# Patient Record
Sex: Male | Born: 1945 | Race: White | Hispanic: No | Smoking: Former smoker
Health system: Southern US, Community
[De-identification: ages and names within clinical notes are randomized; demographics above are authoritative.]

## PROBLEM LIST (undated history)

## (undated) DIAGNOSIS — R739 Hyperglycemia, unspecified: Secondary | ICD-10-CM

## (undated) DIAGNOSIS — R945 Abnormal results of liver function studies: Secondary | ICD-10-CM

## (undated) DIAGNOSIS — K5090 Crohn's disease, unspecified, without complications: Secondary | ICD-10-CM

## (undated) DIAGNOSIS — C801 Malignant (primary) neoplasm, unspecified: Secondary | ICD-10-CM

## (undated) DIAGNOSIS — H269 Unspecified cataract: Secondary | ICD-10-CM

## (undated) DIAGNOSIS — N400 Enlarged prostate without lower urinary tract symptoms: Secondary | ICD-10-CM

## (undated) DIAGNOSIS — R7989 Other specified abnormal findings of blood chemistry: Secondary | ICD-10-CM

## (undated) DIAGNOSIS — I10 Essential (primary) hypertension: Secondary | ICD-10-CM

## (undated) DIAGNOSIS — D589 Hereditary hemolytic anemia, unspecified: Secondary | ICD-10-CM

## (undated) DIAGNOSIS — N200 Calculus of kidney: Secondary | ICD-10-CM

## (undated) DIAGNOSIS — K8309 Other cholangitis: Secondary | ICD-10-CM

## (undated) DIAGNOSIS — L409 Psoriasis, unspecified: Secondary | ICD-10-CM

## (undated) HISTORY — DX: Psoriasis, unspecified: L40.9

## (undated) HISTORY — DX: Hyperglycemia, unspecified: R73.9

## (undated) HISTORY — DX: Other cholangitis: K83.09

## (undated) HISTORY — DX: Hereditary hemolytic anemia, unspecified: D58.9

## (undated) HISTORY — PX: HAMMER TOE SURGERY: SHX385

## (undated) HISTORY — DX: Essential (primary) hypertension: I10

## (undated) HISTORY — DX: Benign prostatic hyperplasia without lower urinary tract symptoms: N40.0

## (undated) HISTORY — PX: LUMBAR LAMINECTOMY: SHX95

## (undated) HISTORY — PX: CYST REMOVAL WITH BONE GRAFT: SHX6365

## (undated) HISTORY — PX: OTHER SURGICAL HISTORY: SHX169

## (undated) HISTORY — DX: Malignant (primary) neoplasm, unspecified: C80.1

## (undated) HISTORY — DX: Calculus of kidney: N20.0

## (undated) HISTORY — DX: Unspecified cataract: H26.9

## (undated) HISTORY — DX: Crohn's disease, unspecified, without complications: K50.90

## (undated) HISTORY — PX: HAND SURGERY: SHX662

## (undated) HISTORY — DX: Other specified abnormal findings of blood chemistry: R79.89

## (undated) HISTORY — DX: Abnormal results of liver function studies: R94.5

## (undated) HISTORY — PX: STAPEDECTOMY: SHX2435

---

## 1997-12-17 ENCOUNTER — Inpatient Hospital Stay (HOSPITAL_COMMUNITY): Admission: EM | Admit: 1997-12-17 | Discharge: 1997-12-22 | Payer: Self-pay | Admitting: Internal Medicine

## 1997-12-17 ENCOUNTER — Encounter (INDEPENDENT_AMBULATORY_CARE_PROVIDER_SITE_OTHER): Payer: Self-pay | Admitting: *Deleted

## 1997-12-17 ENCOUNTER — Encounter: Payer: Self-pay | Admitting: Internal Medicine

## 1997-12-18 ENCOUNTER — Encounter: Payer: Self-pay | Admitting: Hematology and Oncology

## 2000-10-16 ENCOUNTER — Encounter (INDEPENDENT_AMBULATORY_CARE_PROVIDER_SITE_OTHER): Payer: Self-pay | Admitting: *Deleted

## 2000-10-16 ENCOUNTER — Other Ambulatory Visit: Admission: RE | Admit: 2000-10-16 | Discharge: 2000-10-16 | Payer: Self-pay | Admitting: Internal Medicine

## 2000-10-29 ENCOUNTER — Ambulatory Visit (HOSPITAL_COMMUNITY): Admission: RE | Admit: 2000-10-29 | Discharge: 2000-10-29 | Payer: Self-pay | Admitting: Internal Medicine

## 2000-10-29 ENCOUNTER — Encounter: Payer: Self-pay | Admitting: Internal Medicine

## 2003-10-19 ENCOUNTER — Encounter: Payer: Self-pay | Admitting: Internal Medicine

## 2004-01-12 ENCOUNTER — Ambulatory Visit: Payer: Self-pay | Admitting: Internal Medicine

## 2004-05-01 ENCOUNTER — Ambulatory Visit: Payer: Self-pay | Admitting: Internal Medicine

## 2004-05-13 ENCOUNTER — Encounter: Admission: RE | Admit: 2004-05-13 | Discharge: 2004-05-13 | Payer: Self-pay | Admitting: Emergency Medicine

## 2004-05-21 ENCOUNTER — Ambulatory Visit: Payer: Self-pay | Admitting: Internal Medicine

## 2004-10-08 ENCOUNTER — Ambulatory Visit: Payer: Self-pay | Admitting: Oncology

## 2004-10-16 ENCOUNTER — Ambulatory Visit: Payer: Self-pay | Admitting: Internal Medicine

## 2004-11-07 ENCOUNTER — Ambulatory Visit: Payer: Self-pay | Admitting: Internal Medicine

## 2005-02-06 ENCOUNTER — Encounter (INDEPENDENT_AMBULATORY_CARE_PROVIDER_SITE_OTHER): Payer: Self-pay | Admitting: *Deleted

## 2005-02-06 ENCOUNTER — Ambulatory Visit (HOSPITAL_BASED_OUTPATIENT_CLINIC_OR_DEPARTMENT_OTHER): Admission: RE | Admit: 2005-02-06 | Discharge: 2005-02-06 | Payer: Self-pay | Admitting: Orthopedic Surgery

## 2005-03-20 ENCOUNTER — Ambulatory Visit: Payer: Self-pay | Admitting: Internal Medicine

## 2005-08-12 ENCOUNTER — Ambulatory Visit: Payer: Self-pay | Admitting: Internal Medicine

## 2005-08-15 ENCOUNTER — Ambulatory Visit: Payer: Self-pay | Admitting: Internal Medicine

## 2005-09-04 ENCOUNTER — Ambulatory Visit: Payer: Self-pay | Admitting: Internal Medicine

## 2005-10-15 ENCOUNTER — Ambulatory Visit: Payer: Self-pay | Admitting: Internal Medicine

## 2005-12-03 ENCOUNTER — Ambulatory Visit: Payer: Self-pay | Admitting: Internal Medicine

## 2005-12-04 ENCOUNTER — Ambulatory Visit: Payer: Self-pay | Admitting: Internal Medicine

## 2006-05-04 ENCOUNTER — Ambulatory Visit: Payer: Self-pay | Admitting: Internal Medicine

## 2006-05-04 LAB — CONVERTED CEMR LAB
ALT: 81 units/L — ABNORMAL HIGH (ref 0–40)
AST: 49 units/L — ABNORMAL HIGH (ref 0–37)
Albumin: 3.6 g/dL (ref 3.5–5.2)
Alkaline Phosphatase: 202 units/L — ABNORMAL HIGH (ref 39–117)
BUN: 9 mg/dL (ref 6–23)
Basophils Absolute: 0 10*3/uL (ref 0.0–0.1)
Basophils Relative: 0.4 % (ref 0.0–1.0)
Bilirubin, Direct: 0.2 mg/dL (ref 0.0–0.3)
CO2: 30 meq/L (ref 19–32)
Calcium: 9.5 mg/dL (ref 8.4–10.5)
Chloride: 106 meq/L (ref 96–112)
Creatinine, Ser: 0.9 mg/dL (ref 0.4–1.5)
Eosinophils Absolute: 0.2 10*3/uL (ref 0.0–0.6)
Eosinophils Relative: 4 % (ref 0.0–5.0)
GFR calc Af Amer: 111 mL/min
GFR calc non Af Amer: 92 mL/min
Glucose, Bld: 94 mg/dL (ref 70–99)
HCT: 47.3 % (ref 39.0–52.0)
Hemoglobin: 16.8 g/dL (ref 13.0–17.0)
Lymphocytes Relative: 12.5 % (ref 12.0–46.0)
MCHC: 35.5 g/dL (ref 30.0–36.0)
MCV: 90.6 fL (ref 78.0–100.0)
Monocytes Absolute: 0.9 10*3/uL — ABNORMAL HIGH (ref 0.2–0.7)
Monocytes Relative: 14.2 % — ABNORMAL HIGH (ref 3.0–11.0)
Neutro Abs: 4.3 10*3/uL (ref 1.4–7.7)
Neutrophils Relative %: 68.9 % (ref 43.0–77.0)
Platelets: 331 10*3/uL (ref 150–400)
Potassium: 4.1 meq/L (ref 3.5–5.1)
RBC: 5.23 M/uL (ref 4.22–5.81)
RDW: 11.9 % (ref 11.5–14.6)
Sodium: 141 meq/L (ref 135–145)
Total Bilirubin: 0.9 mg/dL (ref 0.3–1.2)
Total Protein: 7.6 g/dL (ref 6.0–8.3)
WBC: 6.2 10*3/uL (ref 4.5–10.5)

## 2006-10-31 ENCOUNTER — Ambulatory Visit: Payer: Self-pay | Admitting: Internal Medicine

## 2006-12-01 ENCOUNTER — Ambulatory Visit: Payer: Self-pay | Admitting: Internal Medicine

## 2007-01-18 ENCOUNTER — Encounter: Payer: Self-pay | Admitting: *Deleted

## 2007-01-18 DIAGNOSIS — D599 Acquired hemolytic anemia, unspecified: Secondary | ICD-10-CM | POA: Insufficient documentation

## 2007-01-18 DIAGNOSIS — S022XXA Fracture of nasal bones, initial encounter for closed fracture: Secondary | ICD-10-CM | POA: Insufficient documentation

## 2007-01-18 DIAGNOSIS — Z9889 Other specified postprocedural states: Secondary | ICD-10-CM | POA: Insufficient documentation

## 2007-01-18 DIAGNOSIS — Z87898 Personal history of other specified conditions: Secondary | ICD-10-CM | POA: Insufficient documentation

## 2007-01-18 DIAGNOSIS — Z87442 Personal history of urinary calculi: Secondary | ICD-10-CM | POA: Insufficient documentation

## 2007-01-18 DIAGNOSIS — J189 Pneumonia, unspecified organism: Secondary | ICD-10-CM | POA: Insufficient documentation

## 2007-01-18 DIAGNOSIS — K5090 Crohn's disease, unspecified, without complications: Secondary | ICD-10-CM | POA: Insufficient documentation

## 2007-01-18 DIAGNOSIS — Z9189 Other specified personal risk factors, not elsewhere classified: Secondary | ICD-10-CM | POA: Insufficient documentation

## 2007-01-18 DIAGNOSIS — K5190 Ulcerative colitis, unspecified, without complications: Secondary | ICD-10-CM | POA: Insufficient documentation

## 2007-01-18 DIAGNOSIS — S42009A Fracture of unspecified part of unspecified clavicle, initial encounter for closed fracture: Secondary | ICD-10-CM | POA: Insufficient documentation

## 2007-01-18 DIAGNOSIS — K8309 Other cholangitis: Secondary | ICD-10-CM

## 2007-01-18 DIAGNOSIS — Z872 Personal history of diseases of the skin and subcutaneous tissue: Secondary | ICD-10-CM | POA: Insufficient documentation

## 2007-01-18 DIAGNOSIS — K8020 Calculus of gallbladder without cholecystitis without obstruction: Secondary | ICD-10-CM | POA: Insufficient documentation

## 2007-01-29 DIAGNOSIS — K589 Irritable bowel syndrome without diarrhea: Secondary | ICD-10-CM

## 2007-05-11 ENCOUNTER — Ambulatory Visit: Payer: Self-pay | Admitting: Internal Medicine

## 2007-07-06 DIAGNOSIS — Z8639 Personal history of other endocrine, nutritional and metabolic disease: Secondary | ICD-10-CM

## 2007-07-06 DIAGNOSIS — Z862 Personal history of diseases of the blood and blood-forming organs and certain disorders involving the immune mechanism: Secondary | ICD-10-CM

## 2007-07-07 ENCOUNTER — Ambulatory Visit: Payer: Self-pay | Admitting: Internal Medicine

## 2007-07-07 DIAGNOSIS — L408 Other psoriasis: Secondary | ICD-10-CM | POA: Insufficient documentation

## 2007-07-13 LAB — CONVERTED CEMR LAB
ALT: 121 units/L — ABNORMAL HIGH (ref 0–53)
AST: 77 units/L — ABNORMAL HIGH (ref 0–37)
Albumin: 3.4 g/dL — ABNORMAL LOW (ref 3.5–5.2)
Alkaline Phosphatase: 467 units/L — ABNORMAL HIGH (ref 39–117)
Basophils Absolute: 0 10*3/uL (ref 0.0–0.1)
Basophils Relative: 0.5 % (ref 0.0–1.0)
Bilirubin, Direct: 0.3 mg/dL (ref 0.0–0.3)
Eosinophils Absolute: 0.2 10*3/uL (ref 0.0–0.7)
Eosinophils Relative: 3.1 % (ref 0.0–5.0)
HCT: 44.6 % (ref 39.0–52.0)
Hemoglobin: 15.7 g/dL (ref 13.0–17.0)
Lymphocytes Relative: 11.9 % — ABNORMAL LOW (ref 12.0–46.0)
MCHC: 35.1 g/dL (ref 30.0–36.0)
MCV: 92.7 fL (ref 78.0–100.0)
Monocytes Absolute: 0.8 10*3/uL (ref 0.1–1.0)
Monocytes Relative: 11.9 % (ref 3.0–12.0)
Neutro Abs: 4.7 10*3/uL (ref 1.4–7.7)
Neutrophils Relative %: 72.6 % (ref 43.0–77.0)
Platelets: 300 10*3/uL (ref 150–400)
RBC: 4.81 M/uL (ref 4.22–5.81)
RDW: 12.6 % (ref 11.5–14.6)
Total Bilirubin: 1.3 mg/dL — ABNORMAL HIGH (ref 0.3–1.2)
Total Protein: 8.2 g/dL (ref 6.0–8.3)
WBC: 6.5 10*3/uL (ref 4.5–10.5)

## 2007-07-14 ENCOUNTER — Ambulatory Visit: Payer: Self-pay | Admitting: Internal Medicine

## 2007-08-18 ENCOUNTER — Ambulatory Visit: Payer: Self-pay | Admitting: Internal Medicine

## 2007-08-19 LAB — CONVERTED CEMR LAB
Bilirubin, Direct: 0.2 mg/dL (ref 0.0–0.3)
Total Bilirubin: 0.9 mg/dL (ref 0.3–1.2)

## 2007-09-28 ENCOUNTER — Encounter: Payer: Self-pay | Admitting: Internal Medicine

## 2007-10-28 ENCOUNTER — Ambulatory Visit: Payer: Self-pay | Admitting: Internal Medicine

## 2007-11-22 ENCOUNTER — Ambulatory Visit: Payer: Self-pay | Admitting: Internal Medicine

## 2007-11-22 LAB — CONVERTED CEMR LAB
ALT: 112 units/L — ABNORMAL HIGH (ref 0–53)
AST: 75 units/L — ABNORMAL HIGH (ref 0–37)
Alkaline Phosphatase: 446 units/L — ABNORMAL HIGH (ref 39–117)
Bilirubin, Direct: 0.3 mg/dL (ref 0.0–0.3)
Total Protein: 7.7 g/dL (ref 6.0–8.3)

## 2007-12-06 ENCOUNTER — Encounter: Payer: Self-pay | Admitting: Internal Medicine

## 2008-01-24 ENCOUNTER — Ambulatory Visit: Payer: Self-pay | Admitting: Internal Medicine

## 2008-06-15 ENCOUNTER — Ambulatory Visit: Payer: Self-pay | Admitting: Internal Medicine

## 2008-06-16 LAB — CONVERTED CEMR LAB
Albumin: 3.5 g/dL (ref 3.5–5.2)
Alkaline Phosphatase: 370 units/L — ABNORMAL HIGH (ref 39–117)
Basophils Absolute: 0 10*3/uL (ref 0.0–0.1)
Bilirubin, Direct: 0.3 mg/dL (ref 0.0–0.3)
Eosinophils Absolute: 0.2 10*3/uL (ref 0.0–0.7)
Lymphocytes Relative: 15.8 % (ref 12.0–46.0)
MCHC: 34.8 g/dL (ref 30.0–36.0)
Neutrophils Relative %: 67.3 % (ref 43.0–77.0)
RDW: 12.4 % (ref 11.5–14.6)

## 2008-09-18 ENCOUNTER — Encounter (INDEPENDENT_AMBULATORY_CARE_PROVIDER_SITE_OTHER): Payer: Self-pay | Admitting: *Deleted

## 2008-09-26 ENCOUNTER — Encounter: Payer: Self-pay | Admitting: Internal Medicine

## 2008-10-19 ENCOUNTER — Ambulatory Visit: Payer: Self-pay | Admitting: Internal Medicine

## 2008-10-25 ENCOUNTER — Ambulatory Visit: Payer: Self-pay | Admitting: Internal Medicine

## 2008-11-08 ENCOUNTER — Ambulatory Visit: Payer: Self-pay | Admitting: Internal Medicine

## 2008-11-08 ENCOUNTER — Encounter: Payer: Self-pay | Admitting: Internal Medicine

## 2008-11-09 ENCOUNTER — Encounter: Payer: Self-pay | Admitting: Internal Medicine

## 2008-11-21 ENCOUNTER — Encounter: Payer: Self-pay | Admitting: Internal Medicine

## 2009-04-13 ENCOUNTER — Ambulatory Visit: Payer: Self-pay | Admitting: Internal Medicine

## 2009-04-16 LAB — CONVERTED CEMR LAB
ALT: 74 units/L — ABNORMAL HIGH (ref 0–53)
BUN: 11 mg/dL (ref 6–23)
Basophils Absolute: 0.1 10*3/uL (ref 0.0–0.1)
Basophils Relative: 1.2 % (ref 0.0–3.0)
CO2: 32 meq/L (ref 19–32)
Calcium: 9.5 mg/dL (ref 8.4–10.5)
Creatinine, Ser: 0.8 mg/dL (ref 0.4–1.5)
GFR calc non Af Amer: 104.15 mL/min (ref >60)
Glucose, Bld: 94 mg/dL (ref 70–99)
HCT: 43.6 % (ref 39.0–52.0)
Hemoglobin: 15.4 g/dL (ref 13.0–17.0)
Lymphs Abs: 0.9 10*3/uL (ref 0.7–4.0)
MCHC: 35.4 g/dL (ref 30.0–36.0)
Monocytes Relative: 10.8 % (ref 3.0–12.0)
Neutro Abs: 4.9 10*3/uL (ref 1.4–7.7)
RBC: 4.73 M/uL (ref 4.22–5.81)
RDW: 14 % (ref 11.5–14.6)
Total Bilirubin: 0.8 mg/dL (ref 0.3–1.2)

## 2009-10-02 ENCOUNTER — Encounter: Payer: Self-pay | Admitting: Internal Medicine

## 2009-11-08 ENCOUNTER — Ambulatory Visit: Payer: Self-pay | Admitting: Internal Medicine

## 2009-11-13 ENCOUNTER — Ambulatory Visit: Payer: Self-pay | Admitting: Internal Medicine

## 2010-01-22 ENCOUNTER — Telehealth (INDEPENDENT_AMBULATORY_CARE_PROVIDER_SITE_OTHER): Payer: Self-pay | Admitting: *Deleted

## 2010-01-24 ENCOUNTER — Other Ambulatory Visit: Payer: Self-pay | Admitting: Internal Medicine

## 2010-01-24 ENCOUNTER — Ambulatory Visit
Admission: RE | Admit: 2010-01-24 | Discharge: 2010-01-24 | Payer: Self-pay | Source: Home / Self Care | Attending: Internal Medicine | Admitting: Internal Medicine

## 2010-01-24 LAB — TESTOSTERONE: Testosterone: 387.67 ng/dL (ref 350.00–890.00)

## 2010-01-24 LAB — PSA: PSA: 1.28 ng/mL (ref 0.10–4.00)

## 2010-01-31 ENCOUNTER — Ambulatory Visit
Admission: RE | Admit: 2010-01-31 | Discharge: 2010-01-31 | Payer: Self-pay | Source: Home / Self Care | Attending: Internal Medicine | Admitting: Internal Medicine

## 2010-02-07 NOTE — Assessment & Plan Note (Signed)
Summary: f/u--ch.    History of Present Illness Visit Type: Follow-up Visit Primary GI MD: [name] MD Primary Provider: Hoover Browns Norins,MD Chief Complaint: follow-up Crohn's Disease  Denies any problems at this time History of Present Illness:   This is a 65 year old white male with inflammatory bowel disease since 1998 which is currently in remission. 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 The diagnosis made on a liver biopsy in March 2005. His last appointment was September 2011 with Dr. Tamala Julian. His last MRCP showed mild dilatation of intrahepatic ducts and stable liver function tests with an AST of 50, ALT of 66, alkaline phosphatase of 269 and bilirubin of 0.8. His last colonoscopy on October 2010 showed no active disease but did show extensive pseudopolyps. His terminal ileum was normal. He is on 6-MP 25 mg daily and Asacol 2.4 g daily. He is treated for psoriasis. He has decreased his alcohol consumption. He has also stopped smoking. He has asymptomatic cholelithiasis. There is a history of hemolytic anemia in 1998 which initially responded to steroids. He has been off steroids now for several years. A bone density according to the patient was normal about 3 years ago   GI Review of Systems      Denies abdominal pain, acid reflux, belching, bloating, chest pain, dysphagia with liquids, dysphagia with solids, heartburn, loss of appetite, nausea, vomiting, vomiting blood, weight loss, and  weight gain.        Denies anal fissure, black tarry stools, change in bowel habit, constipation, diarrhea, diverticulosis, fecal incontinence, heme positive stool, hemorrhoids, irritable bowel syndrome, jaundice, light color stool, liver problems, rectal bleeding, and  rectal pain.    Current Medications (verified): 1)  Centrum   Tabs (Multiple Vitamins-Minerals) .... Take One Tablet Once Daily 2)  Vitamin C .... Take One Tablet Once Daily 3)  Calcium  Plus Vitamin D 500-50 Mg-Unit  Caps (Calcium Carbonate-Vitamin D) .... Take One Capsule Two Times A Day 4)  Vitamin A .... Take One Tablet Once Daily 5)  Asacol 400 Mg  Tbec (Mesalamine) .... Take 6 Tabs By Mouth Once Daily 6)  Vitamin B Complex   Tabs (B Complex Vitamins) .... Take One Tablet Once Daily 7)  Fish Oil   Oil (Fish Oil) .... Take One Capsule Once Daily 8)  Vitamin E .... Take One Tablet Once Daily 9)  Mercaptopurine 50 Mg  Tabs (Mercaptopurine) .... Take 1/2 Tablet By Mouth Once Daily  Allergies (verified): 1)  ! Penicillin 2)  ! * Elevated Temperature/ Mottled Skin  Past History:  Past Medical History: Reviewed history from 01/18/2007 and no changes required. BENIGN PROSTATIC HYPERTROPHY, HX OF (ICD-V13.8) PSORIASIS, HX OF (ICD-V13.3) CHOLELITHIASIS, ASYMPTOMATIC (ICD-574.20) SCLEROSING CHOLANGITIS (ICD-576.1) HEMOLYTIC ANEMIA (ICD-283.9) ULCERATIVE COLITIS (ICD-556.9) CROHN'S DISEASE (ICD-555.9) Hx of PNEUMONIA (ICD-486) NEPHROLITHIASIS, HX OF (ICD-V13.01) Hx of FRACTURE, CLAVICLE (ICD-810.00) Hx of NASAL BONES, CLOSED FRACTURE (ICD-802.0)     Past Surgical History: Reviewed history from 01/18/2007 and no changes required. NEEDLE BIOPSY, LIVER, HX OF (ICD-V15.89) * CYST REMOVAL ON LEFT HAND SECONDARY TO BROKEN FINGER * STAPEDECTOMY FOOT SURGERY, HX OF FOR HAMMER TOE DEFORMITY (ICD-V15.89) LAMINECTOMY, LUMBAR, HX OF (ICD-V45.89)    Family History: Reviewed history from 07/07/2007 and no changes required. No FH of Colon Cancer: father had lung cancer mother had thyroid cancer Family History of Breast Cancer: aunt  aunt had lung cance Family History of Clotting disorder: uncle Family History of Diabetes: brother  Social History: Reviewed history from  07/07/2007 and no changes required. Patient is a former smoker. -d/c'ed 1999 Occupation: Retired Alcohol Use - yes 4 beers per day Daily Caffeine Use 3-4 cups coffee and 3-4 soft drinks per day Illicit  Drug Use - no Patient does not get regular exercise.   Review of Systems  The patient denies allergy/sinus, anemia, anxiety-new, arthritis/joint pain, back pain, blood in urine, breast changes/lumps, change in vision, confusion, cough, coughing up blood, depression-new, fainting, fatigue, fever, headaches-new, hearing problems, heart murmur, heart rhythm changes, itching, muscle pains/cramps, night sweats, nosebleeds, shortness of breath, skin rash, sleeping problems, sore throat, swelling of feet/legs, swollen lymph glands, thirst - excessive, urination - excessive, urination changes/pain, urine leakage, vision changes, and voice change.         Pertinent positive and negative review of systems were noted in the above HPI. All other ROS was otherwise negative.   Vital Signs:  Patient profile:   65 year old male Height:      73 inches Weight:      231 pounds BMI:     30.59 Pulse rate:   80 / minute Pulse rhythm:   regular BP sitting:   158 / 90  (left arm)  Vitals Entered By: Randye Lobo NCMA (November 08, 2009 1:58 PM)  Physical Exam  General:  Well developed, well nourished, no acute distress. Eyes:  PERRLA, no icterus. Abdomen:  Soft, nontender and nondistended. No masses, hepatosplenomegaly or hernias noted. Normal bowel sounds.   Impression & Recommendations:  Problem # 1:  LIVER FUNCTION TESTS, ABNORMAL, HX OF (ICD-V12.2) Patient has primary sclerosing cholangitis based on a liver biopsy in 2005. He currently has stable liver function tests and is followed by Dr. Tamala Julian at [facility]. His last appointment was 2 months ago and his last MRCP one year ago. His next appointment was in one year. Patient has cut back on his alcohol consumption.  Problem # 2:  CHOLELITHIASIS, ASYMPTOMATIC (ICD-574.20) Patient is currently asymptomatic.  Problem # 3:  HEMOLYTIC ANEMIA (ICD-283.9) This is not an active problem.  Problem # 4:  CROHN'S DISEASE (ICD-555.9) Patient has Crohn's colitis in  remission. He is up to date on his colonoscopy. Patient is to continue 6-MP 25 mg daily.  Patient Instructions: 1)  Please schedule a follow-up appointment in 6 months. 2)  You should have LFT's completed 1 week prior to your appointment. 3)  continue 6-MP 25 mg daily. 4)  Followup with Dr. Tamala Julian in one year. 5)  Consider HIDA scan if gallbladder flares up. 6)  Continue a low-fatweight reduction diet. 7)  recall colon 5 years 8)  Copy sent to : Adella Hare, MD 9)  The medication list was reviewed and reconciled.  All changed / newly prescribed medications were explained.  A complete medication list was provided to the patient / caregiver.

## 2010-02-07 NOTE — Progress Notes (Signed)
Summary: REQ OV & Labs  Phone Note Call from Patient   Summary of Call: Pt left vm after refusing to schedule a CPX. He told scheduler "he does not care what the nurse thinks" when we advised he be scheduled for cpx b/c last office visit was 2009. Pt states that he see's other doctors and does not need a physical.   Pt would like office visit w/you, testosterone & psa checked. Does pt need physical? Or would 30 min "f/u" be ok. What labs are ok or needed prior to visit?  Initial call taken by: Charlsie Quest, [place],  January 22, 2010 4:08 PM  Follow-up for Phone Call        followed at [facility] on a regular basis. An E OV for med refills will do. May have PSA v76.44 and testosterone level v13.8  prior to visit Follow-up by: Neena Rhymes MD,  January 22, 2010 6:31 PM  Additional Follow-up for Phone Call Additional follow up Details #1::        Pt aware, appt made for 1/26 @3 :10 w/Dr Norins for 30 min appt, lab orders in IDX. Additional Follow-up by: Denice Paradise,  January 23, 2010 4:33 PM

## 2010-02-07 NOTE — Assessment & Plan Note (Signed)
Summary: FLU Gennette Pac Bonney Leitz   Nurse Visit   Vital Signs:  Patient profile:   65 year old male Temp:     97.5 degrees F oral  Vitals Entered By: Jonathon Resides, [facility]) (November 13, 2009 10:11 AM)  Allergies: 1)  ! Penicillin 2)  ! * Elevated Temperature/ Mottled Skin  Orders Added: 1)  Admin 1st Vaccine [90471] 2)  Flu Vaccine 65yr + [[46431]   Flu Vaccine Consent Questions     Do you have a history of severe allergic reactions to this vaccine? no    Any prior history of allergic reactions to egg and/or gelatin? no    Do you have a sensitivity to the preservative Thimersol? no    Do you have a past history of Guillan-Barre Syndrome? no    Do you currently have an acute febrile illness? no    Have you ever had a severe reaction to latex? no    Vaccine information given and explained to patient? yes    Are you currently pregnant? no    Lot Number:[id number]   Exp Date:07/06/2010   Site Given  Left Deltoid [facility]  November 13, 2009 10:12 AM

## 2010-02-07 NOTE — Letter (Signed)
[facility]: [facility]   Imported By: Phillis Knack 10/19/2009 13:19:17  _____________________________________________________________________  External Attachment:    Type:   Image     Comment:   External Document

## 2010-02-07 NOTE — Assessment & Plan Note (Signed)
Summary: 6 mo f/u...as.    History of Present Illness Visit Type: Follow-up Visit Primary GI MD: [name] MD Primary Provider: Hoover Browns Norins,MD Chief Complaint: Med refills of 6 mp & Asacol History of Present Illness:   65 year old white male with Crohn's disease of the colon ,currently in remission. This is  a six-month followup appointment. Last colonoscopy in October 2010 showed no active Crohn's disease including terminal ileum ,right colon and left colon. He has  a primary sclerosing cholangitis followed by [employer] [facility] ,last appointment in September 2010. His last the MRCP showed mild intrahepatic ductal dilatation but no evidence of portal hypertension he has been on 6 MP 25 mg daily and Asacol 2.4 g daily. He denies any symptom also diarrhea, abdominal pain, jaundice, pruritus. He is trying to cut back on his alcohol intake.   GI Review of Systems      Denies abdominal pain, acid reflux, belching, bloating, chest pain, dysphagia with liquids, dysphagia with solids, heartburn, loss of appetite, nausea, vomiting, vomiting blood, weight loss, and  weight gain.        Denies anal fissure, black tarry stools, change in bowel habit, constipation, diarrhea, diverticulosis, fecal incontinence, heme positive stool, hemorrhoids, irritable bowel syndrome, jaundice, light color stool, liver problems, rectal bleeding, and  rectal pain.    Current Medications (verified): 1)  Centrum   Tabs (Multiple Vitamins-Minerals) .... Take One Tablet Once Daily 2)  Vitamin C .... Take One Tablet Once Daily 3)  Calcium Plus Vitamin D 500-50 Mg-Unit  Caps (Calcium Carbonate-Vitamin D) .... Take One Capsule Daily 4)  Vitamin A .... Take One Tablet Once Daily 5)  Asacol 400 Mg  Tbec (Mesalamine) .... Take 3 Tablets By Mouth Three Times A Day 6)  Vitamin B Complex   Tabs (B Complex Vitamins) .... Take One Tablet Once Daily 7)  Fish Oil   Oil (Fish Oil) .... Take One Capsule Once Daily 8)   Vitamin E .... Take One Tablet Once Daily 9)  Mercaptopurine 50 Mg  Tabs (Mercaptopurine) .... Take 1 Tablet By Mouth Once A Day 10)  Vitamin D 400 Unit Tabs (Cholecalciferol) .... Two Times A Day  Allergies (verified): 1)  ! Penicillin 2)  ! * Elevated Temperature/ Mottled Skin  Past History:  Past Medical History: Reviewed history from 01/18/2007 and no changes required. BENIGN PROSTATIC HYPERTROPHY, HX OF (ICD-V13.8) PSORIASIS, HX OF (ICD-V13.3) CHOLELITHIASIS, ASYMPTOMATIC (ICD-574.20) SCLEROSING CHOLANGITIS (ICD-576.1) HEMOLYTIC ANEMIA (ICD-283.9) ULCERATIVE COLITIS (ICD-556.9) CROHN'S DISEASE (ICD-555.9) Hx of PNEUMONIA (ICD-486) NEPHROLITHIASIS, HX OF (ICD-V13.01) Hx of FRACTURE, CLAVICLE (ICD-810.00) Hx of NASAL BONES, CLOSED FRACTURE (ICD-802.0)     Past Surgical History: Reviewed history from 01/18/2007 and no changes required. NEEDLE BIOPSY, LIVER, HX OF (ICD-V15.89) * CYST REMOVAL ON LEFT HAND SECONDARY TO BROKEN FINGER * STAPEDECTOMY FOOT SURGERY, HX OF FOR HAMMER TOE DEFORMITY (ICD-V15.89) LAMINECTOMY, LUMBAR, HX OF (ICD-V45.89)    Family History: Reviewed history from 07/07/2007 and no changes required. No FH of Colon Cancer: father had lung cancer mother had thyroid cancer Family History of Breast Cancer: aunt  aunt had lung cance Family History of Clotting disorder: uncle Family History of Diabetes: brother  Social History: Reviewed history from 07/07/2007 and no changes required. Patient is a former smoker. -d/c'ed 1999 Occupation: Retired Alcohol Use - yes 4 beers per day Daily Caffeine Use 3-4 cups coffee and 3-4 soft drinks per day Illicit Drug Use - no Patient does not get regular exercise.   Review of Systems  The patient complains of urination - excessive.  The patient denies allergy/sinus, anemia, anxiety-new, arthritis/joint pain, back pain, blood in urine, breast changes/lumps, change in vision, confusion, cough, coughing up  blood, depression-new, fainting, fatigue, fever, headaches-new, hearing problems, heart murmur, heart rhythm changes, itching, menstrual pain, muscle pains/cramps, night sweats, nosebleeds, pregnancy symptoms, shortness of breath, skin rash, sleeping problems, sore throat, swelling of feet/legs, swollen lymph glands, thirst - excessive , urination - excessive , urination changes/pain, urine leakage, vision changes, and voice change.         Pertinent positive and negative review of systems were noted in the above HPI. All other ROS was otherwise negative.   Vital Signs:  Patient profile:   65 year old male Height:      73 inches Weight:      230 pounds BMI:     30.45 Pulse rate:   76 / minute Pulse rhythm:   regular BP sitting:   132 / 90  (left arm) Cuff size:   regular  Vitals Entered By: June McMurray [place] Deborra Medina) (April 13, 2009 8:17 AM)  Physical Exam  General:  alert oriented mildly overweight. Mouth:  normal oral mucosa Neck:  no bruit Heart:  5 inspiratory crackling ralesnormal S1-S2 no murmur Abdomen:  protuberant soft abdomen. Nontender. Normoactive bowel sounds, liver edge at costal margin. No ascites Extremities:  no edema Skin:  psoriatic lesions on the shin on his left leg and between the buttocks Psych:  Alert and cooperative. Normal mood and affect.   Impression & Recommendations:  Problem # 1:  CROHN'S DISEASE (ICD-555.9) Crohn's disease of the colon,currently in remission. We will continue maintenance therapy with 6-MP 25 milligrams daily and Asacol 2.4 g daily. We'll repeat liver function tests and CBC today.Marland Kitchen His next appointment at [facility] in September 2011 Orders: TLB-CBC Platelet - w/Differential ([id number]) TLB-CMP (Comprehensive Metabolic Pnl) ([id number])  Problem # 2:  PSORIASIS (ICD-696.1) followed by dermatology  Problem # 3:  LIVER FUNCTION TESTS, ABNORMAL, HX OF (ICD-V12.2) primary sclerosing cholangitis. Stable as of last MRCP. We will recheck  liver function tests today, he has not had any episodes also cholangitis  Patient Instructions: 1)  Go to the basement today to get your labs drawn 2)  Your Asacol & 6MP have been sent to your mail order pharm for one year. 3)  followup with Dr. Tamala Julian achieved in September 2011 4)  Copy sent to : Micheal Norins,MD 5)  The medication list was reviewed and reconciled.  All changed / newly prescribed medications were explained.  A complete medication list was provided to the patient / caregiver. Prescriptions: MERCAPTOPURINE 50 MG  TABS (MERCAPTOPURINE) take 1/2 tablet by mouth once daily  #50 x 3   Entered by:   Bernita Buffy CMA ([place])   Authorized by:   Lafayette Dragon MD   Signed by:   Bernita Buffy CMA ([place]) on 04/13/2009   Method used:   Electronically to        [facility] (mail-order)       [facility]       [address] [place]  [address]       Ph: [phone]       Fax: [phone]   RxID:   [id number] ASACOL 400 MG  TBEC (MESALAMINE) take 6 tabs by mouth once daily  #540 x 3   Entered by:   Bernita Buffy CMA ([place])   Authorized  by:   Lafayette Dragon MD   Signed by:   Bernita Buffy CMA ([place]) on 04/13/2009   Method used:   Electronically to        [facility] (mail-order)       [facility]       [address] [place]  [address]       Ph: [phone]       Fax: [phone]   [place]:   [phone]

## 2010-02-13 NOTE — Assessment & Plan Note (Signed)
Summary: 30 min ov see emr phone note/#/cd   Vital Signs:  Patient profile:   65 year old male Height:      73 inches Weight:      231 pounds BMI:     30.59 O2 Sat:      95 % on Room air Temp:     98.7 degrees F oral Pulse rate:   83 / minute BP sitting:   142 / 86  (left arm) Cuff size:   regular  Vitals Entered By: Charlynne Cousins CMA (January 31, 2010 3:07 PM)  O2 Flow:  Room air  Primary Care Provider:  Micheal Bitania Shankland,MD   History of Present Illness: Mark [name] presents for general medical follow-up. He has been followed on a regular basis at [place] [facility] for sclerosing cholangitis and sees Dr. Cristal Generous for IBD. He has generally been doing well and is now on an annual schedule of follow-up at [facility]. He has been asymptomatic. He has not had any major health issues, hospitalizations or injuries.  Current Medications (verified): 1)  Centrum   Tabs (Multiple Vitamins-Minerals) .... Take One Tablet Once Daily 2)  Vitamin C .... Take One Tablet Once Daily 3)  Calcium Plus Vitamin D 500-50 Mg-Unit  Caps (Calcium Carbonate-Vitamin D) .... Take One Capsule Two Times A Day 4)  Vitamin A .... Take One Tablet Once Daily 5)  Asacol 400 Mg  Tbec (Mesalamine) .... Take 3 Tabs Two Times A Day 6)  Vitamin B Complex   Tabs (B Complex Vitamins) .... Take One Tablet Once Daily 7)  Fish Oil   Oil (Fish Oil) .... Take One Capsule Once Daily 8)  Vitamin E .... Take One Tablet Once Daily 9)  Mercaptopurine 50 Mg  Tabs (Mercaptopurine) .... Take 1/2 Tablet By Mouth Once Daily  Allergies (verified): 1)  ! Penicillin 2)  ! 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) HEMOLYTIC ANEMIA (ICD-283.9) ULCERATIVE COLITIS (ICD-556.9) CROHN'S DISEASE (ICD-555.9) Hx of PNEUMONIA  (ICD-486) NEPHROLITHIASIS, HX OF (ICD-V13.01) Hx of FRACTURE, CLAVICLE (ICD-810.00) Hx of NASAL BONES, CLOSED FRACTURE (ICD-802.0)     Past Surgical History: Last updated: 01/18/2007 NEEDLE BIOPSY, LIVER, HX OF (ICD-V15.89) * CYST REMOVAL ON LEFT HAND SECONDARY TO BROKEN FINGER * STAPEDECTOMY FOOT SURGERY, HX OF FOR HAMMER TOE DEFORMITY (ICD-V15.89) LAMINECTOMY, LUMBAR, HX OF (ICD-V45.89)    Family History: Last updated: 07/07/2007 No FH of Colon Cancer: father had lung cancer mother had thyroid cancer Family History of Breast Cancer: aunt  aunt had lung cance Family History of Clotting disorder: uncle Family History of Diabetes: brother  Social History: Patient is a former smoker. -d/c'ed 1999 Occupation: Retired-enjoys hiimself. Is having adventures Married - '68-'76/divorced; married '83-'85/divorced; married '92- 61yr/divorced. Lives in own home; brother moved in temporarily in '99.  Activities- [employer]. [employer] to [employer]; burns wood. Alcohol Use - yes 4 beers per day Daily Caffeine Use 3-4 cups coffee and 3-4 soft drinks per day Illicit Drug Use - no Patient does not get regular exercise.   Review of Systems       The patient complains of weight gain.  The patient denies anorexia, fever, weight loss, vision loss, decreased hearing, chest pain, dyspnea on exertion, peripheral edema, headaches, abdominal pain, severe indigestion/heartburn, incontinence, suspicious skin lesions, difficulty walking, depression, abnormal bleeding, enlarged  lymph nodes, and angioedema.         Nocturia x 1. Follows with Dr. Ronnald Ramp for psoriasis - light box treatment.   Physical Exam  General:  WNWD moderately overweight white male in no distress Head:  Normocephalic and atraumatic without obvious abnormalities. No apparent alopecia or balding. Eyes:  No corneal or conjunctival inflammation noted. EOMI. Perrla. Funduscopic exam  benign, without hemorrhages, exudates or papilledema. Vision grossly normal. Ears:  External ear exam shows no significant lesions or deformities.  Otoscopic examination reveals clear canals, tympanic membranes are intact bilaterally without bulging, retraction, inflammation or discharge. Hearing is grossly normal bilaterally. Nose:  no external deformity and no external erythema.   Mouth:  Oral mucosa and oropharynx without lesions or exudates.  Teeth in good repair. Neck:  supple, full ROM, no thyromegaly, and no carotid bruits.   Chest Wall:  No deformities, masses, tenderness or gynecomastia noted. Lungs:  Normal respiratory effort, chest expands symmetrically. Lungs are clear to auscultation, no crackles or wheezes. Heart:  Normal rate and regular rhythm. S1 and S2 normal without gallop, murmur, click, rub or other extra sounds. Abdomen:  soft, non-tender, normal bowel sounds, no distention, no guarding, no rigidity, and no hepatomegaly.   Rectal:  No external abnormalities noted. Normal sphincter tone. No rectal masses or tenderness. Prostate:  Prostate gland firm and smooth, no enlargement, nodularity, tenderness, mass, asymmetry or induration. Msk:  normal ROM, no joint tenderness, no joint swelling, no redness over joints, and no joint instability.   Pulses:  2+ radial pulses Extremities:  No clubbing, cyanosis, edema, or deformity noted with normal full range of motion of all joints.   Neurologic:  alert & oriented X3, cranial nerves II-XII intact, gait normal, and DTRs symmetrical and normal.   Skin:  turgor normal, color normal, no rashes, and no petechiae.   Cervical Nodes:  no anterior cervical adenopathy and no posterior cervical adenopathy.   Psych:  Oriented X3, memory intact for recent and remote, normally interactive, good eye contact, and not anxious appearing.     Impression & Recommendations:  Problem # 1:  PSORIASIS (ICD-696.1) Stable with lesions on the extensor aspect  elbows. He does follow with Dermatology on a regular basis.  Problem # 2:  BENIGN PROSTATIC HYPERTROPHY, HX OF (ICD-V13.8) Not having any significant symptoms: nocturia x 1-2, no signs of prostatism.  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 He has been stable.  Problem # 4:  ULCERATIVE COLITIS (ICD-556.9) Followed by Dr. Olevia Perches. He has not had any recent flares and seems well controlled. He will follow-up with Dr. Olevia Perches as scheduled.    Problem # 5:  Preventive Health Care (ICD-V70.0) Interval history is benign. His exam is without abnormality except for mild psoriatic plaquge. Lab results reveal a normal testosterone level and normal PSA. He is current with colonoscopy with last study Nov '11. Immunizations: Tetnus today; pneumonia vaccine Nov '07; Shingles vaccine July '09; flu Nov  '11.   In summary - a very nice man who is enjoying retirement and who is in good health, medically stable. He will return in 1-2 years for follow-up or as needed.   Complete Medication List: 1)  Centrum Tabs (Multiple vitamins-minerals) .... Take one tablet once daily 2)  Vitamin C  .... Take one tablet once daily 3)  Calcium Plus Vitamin D 500-50 Mg-unit Caps (Calcium carbonate-vitamin d) .... Take one capsule two times a day 4)  Vitamin A  .... Take one tablet once daily 5)  Asacol 400 Mg Tbec (Mesalamine) .... Take 3 tabs two times a day 6)  Vitamin B Complex Tabs (B complex vitamins) .... Take one tablet once daily 7)  Fish Oil Oil (Fish oil) .... Take one capsule once daily 8)  Vitamin E  .... Take one tablet once daily 9)  Mercaptopurine 50 Mg Tabs (Mercaptopurine) .... Take 1/2 tablet by mouth once daily  Other Orders: Tdap => 85yr IM ((24401 Admin 1st Vaccine ((02725   Tests: (1) Testosterone, Total (TESTO)   Testosterone              387.67 ng/dL                350.00-890.00  Tests: (2) Prostate Specific Antigen (PSA)    PSA-Hyb                   1.28 ng/mL                  0.10-4.00   Orders Added: 1)  Tdap => 775yrIM [90715] 2)  Admin 1st Vaccine [90471] 3)  Est. Patient 40-64 years [9[36644] Immunizations Administered:  Tetanus Vaccine:    Vaccine Type: Tdap    Site: left deltoid    Mfr: GlaxoSmithKline    Dose: 0.5 ml    Route: IM    Given by: Ami Bullins CMA    Exp. Date: 10/26/2011    Lot #: [id number]  VIS given: 11/24/07 version given January 31, 2010.   Immunizations Administered:  Tetanus Vaccine:    Vaccine Type: Tdap    Site: left deltoid    Mfr: GlaxoSmithKline    Dose: 0.5 ml    Route: IM    Given by: Ami Bullins CMA    Exp. Date: 10/26/2011    Lot #: [id number]  VIS given: 11/24/07 version given January 31, 2010.

## 2010-05-08 ENCOUNTER — Other Ambulatory Visit: Payer: Self-pay

## 2010-05-08 ENCOUNTER — Other Ambulatory Visit: Payer: Self-pay | Admitting: Internal Medicine

## 2010-05-08 DIAGNOSIS — Z8612 Personal history of poliomyelitis: Secondary | ICD-10-CM

## 2010-05-20 ENCOUNTER — Other Ambulatory Visit (INDEPENDENT_AMBULATORY_CARE_PROVIDER_SITE_OTHER): Payer: PRIVATE HEALTH INSURANCE

## 2010-05-20 DIAGNOSIS — Z8612 Personal history of poliomyelitis: Secondary | ICD-10-CM

## 2010-05-20 LAB — HEPATIC FUNCTION PANEL
ALT: 47 U/L (ref 0–53)
Alkaline Phosphatase: 207 U/L — ABNORMAL HIGH (ref 39–117)
Bilirubin, Direct: 0.1 mg/dL (ref 0.0–0.3)
Total Protein: 7.1 g/dL (ref 6.0–8.3)

## 2010-05-21 ENCOUNTER — Telehealth: Payer: Self-pay | Admitting: *Deleted

## 2010-05-21 NOTE — Telephone Encounter (Signed)
Left message for patient to call me back. 

## 2010-05-21 NOTE — Assessment & Plan Note (Signed)
[facility]                         GASTROENTEROLOGY OFFICE NOTE   NAME:WHITLEYZigmond, Mark [name]                    MRN:          [id number]  DATE:12/01/2006                            DOB:          Apr 13, 1945    Mr. Mark [name] is a 65 year old white male with history of Crohn's colitis  initially thought to be ulcerative colitis and sclerosing cholangitis  diagnosed on liver biopsy and followed by Dr. Lincoln Brigham since March  2005.  He has done extremely well with his inflammatory bowel disease,  he is followed on a yearly basis by Dr. Tamala Julian, last appointment  September 2008.  We have received, has been reviewed, Dr. Tamala Julian has sent  me his progress note.  There has been mild deterioration in his liver  function test but overall clinically he looks pretty stable.  We are  attributing the mild deterioration to two factors, one is that he drinks  about 4 beers a day sometimes more than that and the other factor has  been continued weight gain, his usual weight being 190 pounds, he is  currently up to 228 pounds which may be resulting in fatty liver.  Clinically he is doing excellent.  He really has no complaints in terms  of level of energy, diarrhea, abdominal pain or bleeding.  All his labs,  blood tests were normal except for abnormal liver function test and  elevated ANA titered 1:80.   MEDICATIONS:  1. Vitamin C.  2. Centrum.  3. Calcium supplements with vitamin D.  4. Vitamin A.  5. Asacol 400 mg three times a day.  6. Vitamin B.  7. Fish oil.  8. 6-MP 25 mg daily.  9. Vitamin E.   PHYSICAL EXAMINATION:  Blood pressure 122/82, pulse 80 and weight 228  pounds which is a 5 pounds weight gain since last appointment in April  2008.  He was clearly overweight.  Sclerae is not icteric.  NECK:  Supple without adenopathy.  LUNGS:  Clear to auscultation.  COR:  With normal S1, normal S2,  ABDOMEN:  Protuberant but soft and nontender.  Liver edge was at  costal  margin, there was no ascites. Venous pattern was normal.  Lower abdomen  was normal.  RECTAL:  With 1 to 2+ prostate, stool was soft and Hemoccult negative.  EXTREMITIES:  There was no edema.   IMPRESSION:  A 65 year old white male with stable Crohn's disease on a  very small dose of immunomodulator.  He has a sclerosing cholangitis  which is followed by Dr. Tamala Julian who has been entertaining possibility of  repeat liver biopsies but decided to wait since it may not impact his  overall treatment.   PLAN:  1. I had a long talk with the patient and told him that he needs to      cut back on his alcohol intake and also that he needs to lose about      20 pounds by the time I see him again in 6 months.  He is to      continue all current  medications.  His last colonoscopy was  November 2005, recall colo noscopy planned for November 2010.  Dr.      Tamala Julian may decide that at some point to repeat the liver biopsy, I      will leave it up to him.  The patient has known cholelithiasis, but      he is asymptomatic.  We talked about him cutting back on his fat      intake and calling us if he develops symptoms suggestive of      gallbladder dysfunction.     Lowella Bandy. Olevia Perches, MD  Electronically Signed    DMB/MedQ  DD: 12/01/2006  DT: 12/01/2006  Job #: [id number]   cc:   Lincoln Brigham

## 2010-05-21 NOTE — Telephone Encounter (Signed)
Message copied by Leone Payor on Tue May 21, 2010  8:35 AM ------      Message from: [name]      Created: Mon May 20, 2010 11:03 PM       Please call pt with marked improvement in his LFT's

## 2010-05-21 NOTE — Telephone Encounter (Signed)
Message copied by Dixon Boos on Tue May 21, 2010 12:57 PM ------      Message from: [name]      Created: Tue May 21, 2010 12:25 PM      Regarding: repeat LFT's       No need to repeat the LFT's since he just had them done. And they look good!      ----- Message -----         From: Dixon Boos, CMA         Sent: 05/21/2010   9:30 AM           To: Lafayette Dragon, MD            Dr Olevia Perches-      This patient has his 6 month f/u appt scheduled with you on 05-30-10 for elevated lfts. He was to have labs drawn 1 week prior to his office visit for follow up. However, he had labs completed on 05-08-10 that showed improvement of his lfts. Do you still want him to have labs again this upcoming week or will the 05-08-10 labs be recent enough?      ----- Message -----         From: Dixon Boos, CMA         Sent: 05/21/2010           To: Dixon Boos, CMA                        ----- Message -----         From: Dixon Boos, CMA         Sent: 05/16/2010   1:04 PM           To: Dixon Boos, CMA                        ----- Message -----         From: Dixon Boos, CMA         Sent: 05/16/2010           To: Lafayette Dragon, MD            Pt scheduled for appt with db on 05/30/10. He needs labs for 1 week prior to appt (unless brodie says the 05/08/10 labs are ok)      ----- Message -----         From: Dixon Boos, [place]: 05/07/2010           To: Dixon Boos            patient to have labs done 1 week prior to follow up appointment which should be in May (see 11/08/09 office note.. No sch out yet for may)..did patient schedule f/u appt in may and has he had labs done?

## 2010-05-22 ENCOUNTER — Telehealth: Payer: Self-pay | Admitting: *Deleted

## 2010-05-22 NOTE — Telephone Encounter (Signed)
Opened in error

## 2010-05-22 NOTE — Telephone Encounter (Signed)
Message copied by Leone Payor on Wed May 22, 2010 11:59 AM ------      Message from: [name]      Created: Mon May 20, 2010 11:03 PM       Please call pt with marked improvement in his LFT's

## 2010-05-23 ENCOUNTER — Telehealth: Payer: Self-pay | Admitting: *Deleted

## 2010-05-23 NOTE — Telephone Encounter (Signed)
Left a message for patient that labs were improved and to call for further questions

## 2010-05-23 NOTE — Telephone Encounter (Signed)
Message copied by Leone Payor on Thu May 23, 2010 11:23 AM ------      Message from: [name]      Created: Mon May 20, 2010 11:03 PM       Please call pt with marked improvement in his LFT's

## 2010-05-24 NOTE — Assessment & Plan Note (Signed)
 [facility]ALTHC[facility]                           GASTROENTEROLOGY OFFICE NOTE   NAME:Mark [name], [place]                    MRN:          [id number]  DATE:10/15/2005                            DOB:          [birth year]    Mr. Colee is a 65 -year-old gentleman with Crohn's disease and sclerosing  cholangitis followed by me as well as [facility], by Dr. Tamala Julian. His recent report  from [facility] has been satisfactory. Liver function testing improved. Next  appointment in one year. Today, he is here because of increasing diarrhea  which started shortly after he was given course of Avalox for pneumonia. He  completed seven day course of Avalox 400 mg a day and now has several bowel  movements a day or even at night. They smell differently than this usual  bowel movements. He started himself on Flagyl 250 mg twice a day last week  and is feeling somewhat better, but he still has some current abdominal  pain. Last colonoscopy in October 2005, showed active Crohn's disease as  well as chronic Crohn's disease of the colon characterized by large  __________ polyps and cobblestoning mucosa.   CURRENT MEDICATIONS:  1. Include 6-merycaptopurine 25 mg a day.  2. Asacol 3.6 grams a day.  3. Multiple vitamins.  4. Currently Flagyl 250 mg p.o. b.i.d.   PHYSICAL EXAMINATION:  Blood pressure: 118/76. Pulse: 72. Weight: 218 lb,  which represent 10 pound weight loss since last visit. He still is  moderately overweight, alert and oriented.  LUNGS:  Clear to auscultation.  CARDIAC:  Normal S1, S2.  ABDOMEN:  Soft, obese, nontender with normoactive bowel sounds. No  distention.  RECTAL: Today shows heme positive stool.   IMPRESSION:  65 -year-old white male with Crohn's colitis, new onset  diarrhea, likely related to use of antibiotics _______________  to rule-out  bacterial overgrowth.   PLAN:  1. Increase the Flagyl to 250 mg p.o. q.i.d. for period of two weeks and      then call us  if not improved. He may be started on vancomycin depending      on results of the C-difficile toxin, which will be done today.  2. Continue all his other medications. Consider increasing his 6-MP in the      future if the diarrhea persists despite using Flagyl.       Lowella Bandy. Olevia Perches, MD      DMB/MedQ  DD:  10/15/2005  DT:  10/16/2005  Job #:  [id number]

## 2010-05-24 NOTE — Op Note (Signed)
[name], [name]             ACCOUNT NO.:  [id number]   MEDICAL RECORD NO.:  [id number]          PATIENT TYPE:  AMB   LOCATION:  [place]                          FACILITY:  [facility]   PHYSICIAN:  Youlanda Mighty. Sypher, M.D. DATE OF BIRTH:  10-15-1945   DATE OF PROCEDURE:  02/06/2005  DATE OF DISCHARGE:                                 OPERATIVE REPORT   PREOPERATIVE DIAGNOSIS:  Enlarging mass, left thumb interphalangeal joint,  radial dorsal aspect, rule out ossifying chondroma versus osteophyte  formation due to osteoarthrosis.   POSTOPERATIVE DIAGNOSIS:  Probable osteophyte formation due to underlying  degenerative arthritis.   OPERATION:  Excisional biopsy of firm mass, dorsal radial aspect of left  thumb interphalangeal joint.   OPERATING SURGEON:  Youlanda Mighty. Sypher, M.D.   ASSISTANT:  Herbie Baltimore Dasnoit, P.A.-C.   ANESTHESIA:  0.25% Marcaine with 2% lidocaine metacarpal head level block of  left thumb supplemented by IV sedation.   SUPERVISING ANESTHESIOLOGIST:  Leda Quail, M.D.   INDICATIONS:  Mark [name] is a 65 year old gentleman referred through  the courtesy of Dr. Adella Hare for evaluation of an enlarging mass on  the dorsoradial aspect of his left thumb IP joint.   Mark [name] was noted to have evidence of background osteoarthrosis.  This  mass was firm, round and sessile.   Plain films of the thumb demonstrated early degenerative changes at the  thumb IP joint; however, in the region of the mass, that was stapled  calcification suggestive of an ossifying chondral lesion rather than a true  osteophyte.   Given these circumstances, Mark [name] requested that we excise this lesion  for diagnosis.   After informed consent, he is brought to the operating room at this time.   PROCEDURE:  Mark [name] was brought to the operating room and placed in  a supine position upon the operating table.  Following an anesthesia consult  with Dr. Chriss Driver, local anesthesia with  sedation was selected as appropriate.   Mark [name] was transferred to the operating room and placed in a supine  position upon on the operating table.  After light sedation, left arm was  prepped with alcohol and Betadine, followed by placement of a 0.25% Marcaine  and 2% lidocaine metacarpal head level block, taking care to infiltrate the  region of the proper digital nerves and the radial dorsal sensory branches.   After 10 minutes, he had complete anesthesia of the thumb distal to the  metacarpal head.   The arm was prepped with Betadine soap and solution and sterilely draped.  The thumb was exsanguinated with a gauze wrap followed by placement of a  half-inch Penrose drain as a digital tourniquet at the base of the proximal  phalanx.   The mass was exposed by a curvilinear incision on the radial aspect of the  extensor mechanism.  The subcutaneous tissue were carefully divided, taking  care to spare the dorsal radial sensory branches and the dorsal veins.   A round, firm, shiny cartilaginous mass was identified.   This appeared to be growing off of the radial condyle of the proximal  phalanx.  There was a corresponding osteophyte on the adjacent surface of  the distal phalanx.   The mass was circumferentially dissected and removed with a rongeur  piecemeal.   This appeared to be a cartilaginous osteophyte that was ossifying.   The synovium and osteocartilaginous fragments removed were placed in  formalin passed off for pathologic examination.   The IP joint was entered and inspected.  There was noted be grade 3  chondromalacia with several areas of near full-thickness cartilage loss.   The IP joint was thoroughly lavage with sterile saline utilizing a dental  needle.   The wound was then repaired with mattress sutures of 5-0 nylon.  Care was  taken to preserve the extensor mechanism throughout the procedure.   [name] was placed a compressive dressing of Xeroflo,  sterile gauze and  a Coban wrap.  He has been instructed to elevate his hand for the next 48  hours.  He will return to see [place] for followup in the office in 1 week for  advancement to an exercise program.      Youlanda Mighty. Sypher, M.D.  Electronically Signed     RVS/MEDQ  D:  02/06/2005  T:  02/06/2005  Job:  [id number]   cc:   Heinz Knuckles. Norins, M.D. [facility]  [address]  [place] [address]

## 2010-05-24 NOTE — Assessment & Plan Note (Signed)
[facility] OFFICE NOTE   NAME:WHITLEYJontavious, [place]                    MRN:          [id number]  DATE:05/04/2006                            DOB:          Aug 01, 1945    Mr. 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 Aneta Mins.  He is followed at [facility] on a yearly basis.  His  appointments are usually in the fall, and he is usually set up for liver  function tests there as well as for CT scan of the abdomen.  His last  appointment here was 6 months ago.  He has done really well with his  Crohn disease, being on 6-mercaptopurine 25 mg a day only and Asacol 400  mg, a total of 3.6 g a day in three divided doses.  He has no complaints  today.  The level of energy has been good.  There has been no rectal  bleeding and no diarrhea, no abdominal pain.   MEDICATIONS:  Vitamin C, Centrum vitamins, calcium with vitamin D,  vitamin A, vitamin B, fish oil, 6-MP 25 mg p.o. daily, vitamin E, and  Asacol 400 mg three p.o. t.i.d.   PHYSICAL EXAMINATION:  VITAL SIGNS:  Blood pressure 142/88, pulse 68,  weight 223 pounds, which represents a 4-pound weight gain since last  appointment.  CHEST:  His lungs were clear to auscultation.  CARDIAC:  Normal S1 and normal S2.  ABDOMEN:  Protuberant and obese but very soft and nontender.  Normoactive bowel sounds.  Liver edge at costal margin.  SKIN:  No jaundice.  No stigmata of chronic liver disease.  RECTAL:  Not done.  EXTREMITIES:  No edema today.   IMPRESSION:  42. A 65 year old gentleman with inflammatory bowel disease consistent      with Crohn colitis, currently under good control on a small dose of      immunomodulator.  2. Sclerosing cholangitis, stable, followed by Dr. Tamala Julian at [facility].  3. History of  hemolytic anemia, currently not active.   PLAN:  1. Today CBC and hepatic function.  2. Refills for 6-MP and Asacol.  3. Office visit 6 months.  The patient is due to see Dr. Tamala Julian in      April 2008.     Lowella Bandy. Olevia Perches, MD  Electronically Signed    DMB/MedQ  DD: 05/04/2006  DT: 05/04/2006  Job #: [id number]   cc:   Heinz Knuckles. Norins, MD  Lockie Mola, M.D.

## 2010-05-24 NOTE — Assessment & Plan Note (Signed)
[facility]                         GASTROENTEROLOGY OFFICE NOTE   NAME:WHITLEYApolo, Cutshaw                    MRN:          [id number]  DATE:12/03/2005                            DOB:          07/04/1945    Mr. Huesman is a 65 year old gentleman with Crohn's disease of the  colon, hemolytic anemia, autoimmune, primary sclerosing cholangitis,  cholelithiasis.  Liver biopsy in March 2005 at [facility].  He recently  developed diarrhea.  We were not sure whether it was a flare up of  Crohn's disease or antibiotic related diarrhea due to Avelox.  It turned  out that he responded to Flagyl 250 mg three times a day.  After 2 weeks  the diarrhea subsided.  He is still not completely well, but less bowel  movements.  No nocturnal stools.  Consistency has been improved.  He  overall is feeling better.  His last appointment at [facility] for primary  sclerosing cholangitis was September 2007, and he had a favorable  report.  His liver function tests were stable.  Last colonoscopy was  done in October 2005; showed active colitis.   MEDICATIONS:  1. Vitamin A, D, C.  2. Multiple vitamins.  3. Asacol 400 mg 3 tablets three times a day.  4. 6 MP 50 mg 1/2 tablet daily.   He discontinued his Flagyl about 2 or 3 weeks ago.   PHYSICAL EXAMINATION:  Blood pressure 172/82.  Pulse 80.  Weight 219  pounds.  Patient was not examined today.  We just discussed the resolution of his  diarrhea, and future plans for treatment of his Crohn's disease as well  as his liver disease.   PLAN:  1. Stay on the Flagyl 250 mg p.o. b.i.d. until diarrhea completely      resolves.  2. Patient is to have a flu shot and Pneumovax tomorrow.  3. Follow up at [facility] on a yearly basis.  Next appointment September      2008.  4. Next colonoscopy would be 5 years from October 2005, which would be      October 2010, unless he develops special symptomatology requiring      repeat colonoscopy.  5. Follow  liver function tests, next one in 6 months when I will see      him in the office for followup.     Lowella Bandy. Olevia Perches, MD  Electronically Signed    DMB/MedQ  DD: 12/03/2005  DT: 12/03/2005  Job #: [id number]   cc:   Heinz Knuckles. Norins, MD

## 2010-05-30 ENCOUNTER — Ambulatory Visit (INDEPENDENT_AMBULATORY_CARE_PROVIDER_SITE_OTHER): Admitting: Internal Medicine

## 2010-05-30 ENCOUNTER — Encounter: Payer: Self-pay | Admitting: Internal Medicine

## 2010-05-30 VITALS — BP 140/90 | HR 64 | Ht 72.0 in | Wt 233.0 lb

## 2010-05-30 DIAGNOSIS — K5010 Crohn's disease of large intestine without complications: Secondary | ICD-10-CM

## 2010-05-30 DIAGNOSIS — K8309 Other cholangitis: Secondary | ICD-10-CM

## 2010-05-30 MED ORDER — MERCAPTOPURINE 50 MG PO TABS: ORAL_TABLET | ORAL | Status: DC

## 2010-05-30 NOTE — Progress Notes (Signed)
Mark [name] Aug 26, 1945 MRN [id number]      History of Present Illness:  This is a 65 year old white male with known Crohn's disease since 1998 when he presented with hemolytic anemia, abnormal liver function tests and sclerosing cholangitis confirmed  on a liver biopsy in 2005. He has been followed at [facility] by DR V.Tamala Julian, on a yearly basis. His next appointment is to be in September 2012. He has been stable from the standpoint of his Crohn's disease, being on a very small dose of 6 MP 25 mg daily and mesalamine 2.4 g daily. He denies any abdominal pain, change in the level of energy, jaundice, pruritus or diarrhea. His weight has remained stable but excessive.Marland Kitchen He continues to drink 4-6 beers a day. His last colonoscopy in October 2010 showed extensive pseudopolyposis and a normal terminal ileum. His most recent liver function tests were very good with an AST of 39, ALT 47 and alkaline phosphatase of 207, and albumin of 3.5.   Past Medical History  Diagnosis Date  . Benign prostatic hypertrophy   . Psoriasis   . Cholelithiasis     asymptomatic  . Sclerosing cholangitis   . Hemolytic anemia   . Nephrolithiasis   . Crohn disease   . Abnormal LFTs    Past Surgical History  Procedure Date  . Hand surgery     cyst removal- left hand secondary to broken finger  . Stapedectomy   . Hammer toe surgery   . Lumbar laminectomy     reports that he quit smoking about 13 years ago. He does not have any smokeless tobacco history on file. He reports that he drinks alcohol. He reports that he does not use illicit drugs. family history includes Breast cancer in an unspecified family member; Clotting disorder in an unspecified family member; Diabetes in his brother; Lung cancer in an unspecified family member; and Thyroid cancer in his mother.  There is no history of Colon cancer and Lung cancer. Allergies  Allergen Reactions  . Penicillins     REACTION: hives  . Typhoid Vaccines          Review of Systems: Denies dysphagia, odynophagia, abdominal pain, jaundice or rectal bleeding.  The remainder of the 10  point ROS is negative except as outlined in H&P   Physical Exam: General appearance  Well developed, in no distress. Eyes- non icteric. HEENT nontraumatic, normocephalic. Mouth no lesions, tongue papillated, no cheilosis. Neck supple without adenopathy, thyroid not enlarged, no carotid bruits, no JVD. Lungs Clear to auscultation bilaterally. Cor normal S1 normal S2, regular rhythm , no murmur,  quiet precordium. Abdomen obese soft nontender. No ascites. Liver edge at costal margin. Percussion 8-9 cm. Splenic tip palpable. Rectal: Not done. Extremities no pedal edema. Skin no lesions, no stigmata of chronic liver disease. Neurological alert and oriented x 3. Psychological normal mood and affect.  Assessment and Plan:  Problems #1 Crohn's disease. Patient is asymptomatic. He is in remission on a small dose of 6-MP and on mesalamine 2.4 g a day. His next colonoscopy will be due in October 2015.  Problem #2 sclerosing cholangitis. Stable. Liver function tests are very stable despite her pain using excess alcohol. We,  again, had a long talk concerning his alcohol use.  Problem #3 gallstones. Asymptomatic.  Plan: continue 6-MP 25 mg daily          Continue mesalamine 2.4 g daily           Patient is to see Dr.  Tamala Julian in September 2012           I will see him in the Fall of 2012.    05/30/2010 [name]

## 2010-05-30 NOTE — Patient Instructions (Addendum)
We will send refills of 91m to your pharmacy. You should take 25 mg once daily. CC: Dr MJerilynn MagesNorins, Dr V[facility]ajaun Goldring, [facility]

## 2010-10-08 ENCOUNTER — Other Ambulatory Visit: Payer: Self-pay | Admitting: Dermatology

## 2010-11-06 ENCOUNTER — Ambulatory Visit (INDEPENDENT_AMBULATORY_CARE_PROVIDER_SITE_OTHER): Payer: PRIVATE HEALTH INSURANCE | Admitting: Internal Medicine

## 2010-11-06 ENCOUNTER — Encounter: Payer: Self-pay | Admitting: Internal Medicine

## 2010-11-06 VITALS — BP 160/90 | HR 80 | Ht 72.0 in | Wt 232.0 lb

## 2010-11-06 DIAGNOSIS — D849 Immunodeficiency, unspecified: Secondary | ICD-10-CM

## 2010-11-06 DIAGNOSIS — K5010 Crohn's disease of large intestine without complications: Secondary | ICD-10-CM

## 2010-11-06 NOTE — Patient Instructions (Addendum)
CC: Dr [name], Dr Lynnae Sandhoff [facility]

## 2010-11-06 NOTE — Progress Notes (Signed)
Mark [name] Feb 20, 1945 MRN [id number]    History of Present Illness:  This is a 65 year old white male with Crohn's disease since 89. He has hemolytic anemia. He has sclerosing cholangitis confirmed on a liver biopsy in 2005 and is followed at [facility]. He remains asymptomatic on 6-MP 25 mg a day and mesalamine 2.4 g a day. He has asymptomatic gallstones. His liver function tests have been stable. His last colonoscopy in October 2010 showed pseudopolyps and a normal terminal ileum.   Past Medical History  Diagnosis Date  . Benign prostatic hypertrophy   . Psoriasis   . Cholelithiasis     asymptomatic  . Sclerosing cholangitis   . Hemolytic anemia   . Nephrolithiasis   . Crohn disease   . Abnormal LFTs    Past Surgical History  Procedure Date  . Hand surgery     cyst removal- left hand secondary to broken finger  . Stapedectomy   . Hammer toe surgery   . Lumbar laminectomy   . Dental implant     reports that he quit smoking about 13 years ago. He does not have any smokeless tobacco history on file. He reports that he drinks alcohol. He reports that he does not use illicit drugs. family history includes Breast cancer in an unspecified family member; Clotting disorder in an unspecified family member; Diabetes in his brother; Lung cancer in an unspecified family member; and Thyroid cancer in his mother.  There is no history of Colon cancer and Lung cancer. Allergies  Allergen Reactions  . Penicillins     REACTION: hives  . Typhoid Vaccines         Review of Systems:  The remainder of the 10 point ROS is negative except as outlined in H&P   Physical Exam: General appearance  Well developed, in no distress. Eyes- non icteric. HEENT nontraumatic, normocephalic. Mouth no lesions, tongue papillated, no cheilosis. Neck supple without adenopathy, thyroid not enlarged, no carotid bruits, no JVD. Lungs Clear to auscultation bilaterally. Cor normal S1, normal S2, regular  rhythm, no murmur,  quiet precordium. Abdomen: Protuberant soft abdomen, liver edge at costal margin. No ascites. No tenderness. Rectal: Hemoccult negative stool. Extremities no pedal edema. Skin psoriasis of lower extremities. Neurological alert and oriented x 3. Psychological normal mood and affect.  Assessment and Plan:  Problem #1 Crohn's colitis in symptomatic remission. His sclerosing cholangitis is stable. Patient will continue on the same regimen of 6-MP and mesalamine. His labs are up-to-date. His next colonoscopy will be due in 2015. His next appointment will be due in 6 months.   11/06/2010 Mark [name]

## 2010-11-13 ENCOUNTER — Ambulatory Visit (INDEPENDENT_AMBULATORY_CARE_PROVIDER_SITE_OTHER): Payer: PRIVATE HEALTH INSURANCE | Admitting: *Deleted

## 2010-11-13 ENCOUNTER — Ambulatory Visit: Payer: PRIVATE HEALTH INSURANCE

## 2010-11-13 DIAGNOSIS — Z23 Encounter for immunization: Secondary | ICD-10-CM

## 2010-12-02 ENCOUNTER — Other Ambulatory Visit: Payer: Self-pay | Admitting: Internal Medicine

## 2010-12-02 MED ORDER — MESALAMINE 400 MG PO TBEC: DELAYED_RELEASE_TABLET | ORAL | Status: DC

## 2010-12-02 NOTE — Telephone Encounter (Signed)
rx sent

## 2011-04-14 ENCOUNTER — Other Ambulatory Visit: Payer: Self-pay | Admitting: Internal Medicine

## 2011-04-14 MED ORDER — MERCAPTOPURINE 50 MG PO TABS: ORAL_TABLET | ORAL | Status: DC

## 2011-04-14 MED ORDER — MESALAMINE 400 MG PO TBEC: DELAYED_RELEASE_TABLET | ORAL | Status: DC

## 2011-04-14 NOTE — Telephone Encounter (Signed)
I have spoken to patient and advised him that he needs office visit (Dr Olevia Perches recommended 6 month follow up in last office visit). He states that he scheduled office visit for 05-20-11@ 9:45 am. However, there is no appointment in the system for patient. I have scheduled patient for 9:30 am on 05-20-11 and have advised patient that I will send a 3 month supply of his meds with 0 refills until his office visit. Patient verbalizes understanding.

## 2011-04-18 ENCOUNTER — Other Ambulatory Visit: Payer: Self-pay | Admitting: Internal Medicine

## 2011-04-18 MED ORDER — MESALAMINE 400 MG PO CPDR: 3.0000 | DELAYED_RELEASE_CAPSULE | Freq: Two times a day (BID) | ORAL | Status: DC

## 2011-04-18 NOTE — Telephone Encounter (Signed)
Asacol has been discontinued. I will send rx for Delzicol and have advised patient.

## 2011-05-20 ENCOUNTER — Other Ambulatory Visit (INDEPENDENT_AMBULATORY_CARE_PROVIDER_SITE_OTHER): Payer: PRIVATE HEALTH INSURANCE

## 2011-05-20 ENCOUNTER — Ambulatory Visit (INDEPENDENT_AMBULATORY_CARE_PROVIDER_SITE_OTHER): Payer: PRIVATE HEALTH INSURANCE | Admitting: Internal Medicine

## 2011-05-20 ENCOUNTER — Encounter: Payer: Self-pay | Admitting: Internal Medicine

## 2011-05-20 VITALS — BP 172/100 | HR 72 | Ht 72.0 in | Wt 234.8 lb

## 2011-05-20 DIAGNOSIS — K8309 Other cholangitis: Secondary | ICD-10-CM

## 2011-05-20 DIAGNOSIS — K5090 Crohn's disease, unspecified, without complications: Secondary | ICD-10-CM

## 2011-05-20 DIAGNOSIS — R7402 Elevation of levels of lactic acid dehydrogenase (LDH): Secondary | ICD-10-CM

## 2011-05-20 LAB — COMPREHENSIVE METABOLIC PANEL
ALT: 40 U/L (ref 0–53)
AST: 36 U/L (ref 0–37)
Albumin: 3.5 g/dL (ref 3.5–5.2)
Calcium: 9.4 mg/dL (ref 8.4–10.5)
Chloride: 102 mEq/L (ref 96–112)
Creatinine, Ser: 0.7 mg/dL (ref 0.4–1.5)
Potassium: 4.5 mEq/L (ref 3.5–5.1)

## 2011-05-20 LAB — CBC WITH DIFFERENTIAL/PLATELET
Basophils Absolute: 0.1 10*3/uL (ref 0.0–0.1)
Eosinophils Absolute: 0.2 10*3/uL (ref 0.0–0.7)
Lymphocytes Relative: 13 % (ref 12.0–46.0)
MCHC: 34.3 g/dL (ref 30.0–36.0)
Neutrophils Relative %: 72.5 % (ref 43.0–77.0)
Platelets: 314 10*3/uL (ref 150.0–400.0)
RDW: 13.7 % (ref 11.5–14.6)

## 2011-05-20 MED ORDER — MERCAPTOPURINE 50 MG PO TABS: ORAL_TABLET | ORAL | Status: DC

## 2011-05-20 MED ORDER — MESALAMINE 400 MG PO CPDR: 3.0000 | DELAYED_RELEASE_CAPSULE | Freq: Two times a day (BID) | ORAL | Status: DC

## 2011-05-20 NOTE — Patient Instructions (Addendum)
We have sent the following medications to your pharmacy: Delzicol 6 MP  Your physician has requested that you go to the basement for the following lab work before leaving today: CBC, CMET, GGT Please follow up with Dr Olevia Perches in 6 months. CC: Dr Adella Hare, Dr Gasper Lloyd Selma

## 2011-05-20 NOTE — Progress Notes (Signed)
Mark [name] June 18, 1945 MRN [id number]    History of Present Illness:  This is a 66 year old white male with Crohn's colitis diagnosed in 1998 when he presented with hemolytic anemia. He was later diagnosed with sclerosing cholangitis which was confirmed on a liver biopsy in 2005. He has been followed by Dr. Fara Boros at [facility]. His last appointment with him was September 2012. He sees him on a yearly basis. He has had no symptoms from his inflammatory bowel disease or from his liver disease; specifically no fatigue, pruritus, rectal bleeding or diarrhea. His main problem remains alcohol intake and being overweight. He continues to drink more than a case of beer a week. His last liver function tests in September 2012 were the best they have ever been. The alkaline phosphatase was down to 207, AST was 39, albumin was 3.5 and ALT was 47. He is up-to-date on his colonoscopy with his last one being in October 2010. He had no active disease at the time but he had extensive pseudopolyposis. He is being treated for psoriasis at [facility] dermatology. His maintenance therapy for Crohn's disease is 6 MP 25 mg daily and Delzicol 1.2 g twice a day. He has asymptomatic gallstones.   Past Medical History  Diagnosis Date  . Benign prostatic hypertrophy   . Psoriasis   . Cholelithiasis     asymptomatic  . Sclerosing cholangitis   . Hemolytic anemia   . Nephrolithiasis   . Crohn disease   . Abnormal LFTs    Past Surgical History  Procedure Date  . Hand surgery     cyst removal- left hand secondary to broken finger  . Stapedectomy   . Hammer toe surgery   . Lumbar laminectomy   . Dental implant     reports that he quit smoking about 14 years ago. He has never used smokeless tobacco. He reports that he drinks alcohol. He reports that he does not use illicit drugs. family history includes Breast cancer in an unspecified family member; Clotting disorder in an unspecified family member; Diabetes in  his brother; Lung cancer in an unspecified family member; and Thyroid cancer in his mother.  There is no history of Colon cancer and Lung cancer. Allergies  Allergen Reactions  . Penicillins     REACTION: hives  . Typhoid Vaccines         Review of Systems: denies heartburn, dysphagia, chest pain, rectal bleeding or jaundice  The remainder of the 10 point ROS is negative except as outlined in H&P   Physical Exam: General appearance  Well developed, in no distress. Overweight Eyes- non icteric. HEENT nontraumatic, normocephalic. Mouth no lesions, tongue papillated, no cheilosis. Neck supple without adenopathy, thyroid not enlarged, no carotid bruits, no JVD. Lungs Clear to auscultation bilaterally. Cor normal S1, normal S2, regular rhythm, no murmur,  quiet precordium. Abdomen: Large protuberant but soft with no evidence of ascites. Liver span about 9 cm by percussion. No tenderness. Splenic tip not palpable. Rectal: Not done. Extremities trace pedal edema. Skin psoriasis lower extremities Neurological alert and oriented x 3. Psychological normal mood and affect.  Assessment and Plan:  Problem #1 Crohn's colitis is stable and asymptomatic on a maintenance therapy with 6-MP and mesalamine. We will continue the same regimen. A recall colonoscopy will be due in October 2015.  Problem #2 Asymptomatic, stablet sclerosing cholangitis. We will repeat his CBC and hepatic function as well as GGT today. He is due for his appointment at [facility] in September 2013.  Problem #3 Alcohol intake. Again, I have counseled him about stopping alcohol or reducing it significantly.  Problem #4 Hemolytic anemia. This is currently not a problem.   05/20/2011 [name]

## 2011-10-01 DIAGNOSIS — K8301 Primary sclerosing cholangitis: Secondary | ICD-10-CM | POA: Insufficient documentation

## 2011-10-01 DIAGNOSIS — L409 Psoriasis, unspecified: Secondary | ICD-10-CM | POA: Insufficient documentation

## 2011-10-01 DIAGNOSIS — K5100 Ulcerative (chronic) pancolitis without complications: Secondary | ICD-10-CM | POA: Insufficient documentation

## 2011-10-14 ENCOUNTER — Ambulatory Visit (INDEPENDENT_AMBULATORY_CARE_PROVIDER_SITE_OTHER): Payer: PRIVATE HEALTH INSURANCE | Admitting: Internal Medicine

## 2011-10-14 ENCOUNTER — Encounter: Payer: Self-pay | Admitting: Internal Medicine

## 2011-10-14 VITALS — BP 160/80 | HR 60 | Ht 72.0 in | Wt 235.0 lb

## 2011-10-14 DIAGNOSIS — K5010 Crohn's disease of large intestine without complications: Secondary | ICD-10-CM

## 2011-10-14 DIAGNOSIS — D849 Immunodeficiency, unspecified: Secondary | ICD-10-CM

## 2011-10-14 DIAGNOSIS — Z7901 Long term (current) use of anticoagulants: Secondary | ICD-10-CM

## 2011-10-14 MED ORDER — MESALAMINE 400 MG PO CPDR: 1200.0000 mg | DELAYED_RELEASE_CAPSULE | Freq: Two times a day (BID) | ORAL | Status: DC

## 2011-10-14 MED ORDER — MERCAPTOPURINE 50 MG PO TABS: ORAL_TABLET | ORAL | Status: DC

## 2011-10-14 NOTE — Patient Instructions (Addendum)
We have sent the following medications to your pharmacy: Delzicol 6 MP Please follow up with Dr Olevia Perches in 6 months. CC: Dr Adella Hare

## 2011-10-14 NOTE — Progress Notes (Signed)
Mark [name] 04/04/1945 MRN [id number]   History of Present Illness:  This is a 66 year old white male with Crohn's colitis diagnosed in 1998 when he presented with hemolytic anemia and responded to systemic steroids. Later, he was diagnosed with a sclerosing cholangitis by a liver biopsy and has been followed at [facility] by Dr. Tamala Julian with his last appointment being in September 2013. He has stable liver disease with normal synthetic function . The only problem has been that he is overweight as well as his alcohol consumption. His last colonoscopy in October 2010 showed no active disease. He had extensive pseudopolyps. He has been on 6-MP 25 mg qd  and Delzicol 1.2 g twice a day. He has known asymptomatic cholelithiasis. He has no complaints today.    Past Medical History  Diagnosis Date  . Benign prostatic hypertrophy   . Psoriasis   . Cholelithiasis     asymptomatic  . Sclerosing cholangitis   . Hemolytic anemia   . Nephrolithiasis   . Crohn disease   . Abnormal LFTs    Past Surgical History  Procedure Date  . Hand surgery     cyst removal- left hand secondary to broken finger  . Stapedectomy   . Hammer toe surgery   . Lumbar laminectomy   . Dental implant     reports that he quit smoking about 14 years ago. He has never used smokeless tobacco. He reports that he drinks alcohol. He reports that he does not use illicit drugs. family history includes Breast cancer in an unspecified family member; Clotting disorder in an unspecified family member; Diabetes in his brother; Lung cancer in an unspecified family member; and Thyroid cancer in his mother.  There is no history of Colon cancer and Lung cancer. Allergies  Allergen Reactions  . Penicillins     REACTION: hives  . Typhoid Vaccines         Review of Systems: Denies heartburn dysphagia rectal bleeding  The remainder of the 10 point ROS is negative except as outlined in H&P   Physical Exam: General appearance  Well  developed, in no distress. Eyes- non icteric. HEENT nontraumatic, normocephalic. Mouth no lesions, tongue papillated, no cheilosis. Neck supple without adenopathy, thyroid not enlarged, no carotid bruits, no JVD. Lungs Clear to auscultation bilaterally. Cor normal S1, normal S2, regular rhythm, no murmur,  quiet precordium. Abdomen: Protuberant abdomen is soft nontender. Liver edge at costal margin. No ascites. Rectal: Not done. Extremities no pedal edema. Skin no lesions. Neurological alert and oriented x 3. Psychological normal mood and affect.  Assessment and Plan:  Problem #1 Crohn's colitis, stable and asymptomatic. He will be due for a recall colonoscopy in October 2015. Plan is to continue on 6-MP and mesalamine.  Problem #2 Sclerosing cholangitis followed at [facility]. His last appointment there was last month. He is doing well. The only problem has been continued alcohol consumption. Again, we have discussed cutting back on his alcohol. He will need repeat liver function tests in 6 months, alpha-fetoprotein and ultrasound of the liver.  Problem #3 Hemolytic anemia. Currently not a problem.  Problem #4 Psoriasis, followed by dermatology.   10/14/2011 [name]

## 2011-12-23 ENCOUNTER — Other Ambulatory Visit: Payer: Self-pay | Admitting: Dermatology

## 2012-04-13 ENCOUNTER — Encounter: Payer: Self-pay | Admitting: Internal Medicine

## 2012-04-13 ENCOUNTER — Ambulatory Visit (INDEPENDENT_AMBULATORY_CARE_PROVIDER_SITE_OTHER): Payer: PRIVATE HEALTH INSURANCE | Admitting: Internal Medicine

## 2012-04-13 ENCOUNTER — Other Ambulatory Visit (INDEPENDENT_AMBULATORY_CARE_PROVIDER_SITE_OTHER): Payer: PRIVATE HEALTH INSURANCE

## 2012-04-13 VITALS — BP 162/90 | HR 72 | Ht 71.0 in | Wt 236.0 lb

## 2012-04-13 DIAGNOSIS — K8309 Other cholangitis: Secondary | ICD-10-CM

## 2012-04-13 DIAGNOSIS — K5010 Crohn's disease of large intestine without complications: Secondary | ICD-10-CM

## 2012-04-13 DIAGNOSIS — R7989 Other specified abnormal findings of blood chemistry: Secondary | ICD-10-CM

## 2012-04-13 LAB — CBC WITH DIFFERENTIAL/PLATELET
Basophils Relative: 0.8 % (ref 0.0–3.0)
Eosinophils Absolute: 0.1 10*3/uL (ref 0.0–0.7)
HCT: 43.5 % (ref 39.0–52.0)
Lymphs Abs: 0.8 10*3/uL (ref 0.7–4.0)
MCHC: 34.6 g/dL (ref 30.0–36.0)
MCV: 91.4 fl (ref 78.0–100.0)
Monocytes Absolute: 0.9 10*3/uL (ref 0.1–1.0)
Neutrophils Relative %: 72.8 % (ref 43.0–77.0)
RBC: 4.76 Mil/uL (ref 4.22–5.81)

## 2012-04-13 MED ORDER — MERCAPTOPURINE 50 MG PO TABS: ORAL_TABLET | ORAL | Status: DC

## 2012-04-13 MED ORDER — MESALAMINE 400 MG PO CPDR: 1200.0000 mg | DELAYED_RELEASE_CAPSULE | Freq: Two times a day (BID) | ORAL | Status: DC

## 2012-04-13 MED ORDER — TESTOSTERONE 20.25 MG/ACT (1.62%) TD GEL: 40.5000 mg | Freq: Every day | TRANSDERMAL | Status: DC

## 2012-04-13 NOTE — Patient Instructions (Addendum)
Your physician has requested that you go to the basement for the following lab work before leaving today: CBC, LFT'S, AFP, PSA, Testosterone  We have sent the following medications to your pharmacy for you to pick up at your convenience: Testosterone  We have sent the following prescriptions to your mail in pharmacy: Delzicol 6MP  If you have not heard from your mail in pharmacy within 1 week or if you have not received your medication in the mail, please contact us at [phone] so we may find out why.  CC: Dr Adella Hare

## 2012-04-13 NOTE — Progress Notes (Signed)
Mark [name] 1945-01-17 MRN [id number]  History of Present Illness:  This is a 67 year old white male with Crohn's colitis since 1998. He initially presentated with hemolytic anemia. Later on, he was evaluated for abnormal liver function tests and had  a liver biopsy in March 2005 which confirmed sclerosing cholangitis. He has been followed by Dr. Reginia Naas. His Crohn's colitis has been in remission now for several years on very low dose 6MP and Mesalamine.. He has gained a lot of weight due to drinking excess alcohol and not following dietary modifications. His last colonoscopy in October 2010 showed no active colitis and multiple pseudopolyps. He has been on 6 MP 25 mg daily and mesalamine 1.2 g twice a day. He has asymptomatic gallstones. His next appointment at [facility] will be in September 2014. His last MRI of the liver was in 2012. AFP has been normal. His liver function has been very stable having normal synthetic activity and no symptoms of decompensation.    Past Medical History  Diagnosis Date  . Benign prostatic hypertrophy   . Psoriasis   . Cholelithiasis     asymptomatic  . Sclerosing cholangitis   . Hemolytic anemia   . Nephrolithiasis   . Crohn disease   . Abnormal LFTs    Past Surgical History  Procedure Laterality Date  . Hand surgery      cyst removal- left hand secondary to broken finger  . Stapedectomy    . Hammer toe surgery    . Lumbar laminectomy    . Dental implant      reports that he quit smoking about 15 years ago. He has never used smokeless tobacco. He reports that  drinks alcohol. He reports that he does not use illicit drugs. family history includes Breast cancer in an unspecified family member; Clotting disorder in an unspecified family member; Diabetes in his brother; Lung cancer in an unspecified family member; and Thyroid cancer in his mother.  There is no history of Colon cancer and Lung cancer. Allergies  Allergen Reactions  . Penicillins     REACTION: hives  . Typhoid Vaccines         Review of Systems: Denies heartburn, dysphagia, rectal bleeding, jaundice  The remainder of the 10 point ROS is negative except as outlined in H&P   Physical Exam: General appearance  Well developed, in no distress. Overweight Eyes- non icteric. HEENT nontraumatic, normocephalic. Mouth no lesions, tongue papillated, no cheilosis. Neck supple without adenopathy, thyroid not enlarged, no carotid bruits, no JVD. Lungs Clear to auscultation bilaterally. Cor normal S1, normal S2, regular rhythm, no murmur,  quiet precordium. Abdomen: Large protuberant abdomen. No ascites. Liver edge at costal margin. Splenic tip not palpable. Lower abdomen unremarkable. Rectal: Soft Hemoccult negative stool. Extremities trace pedal edema. Skin no lesions. Neurological alert and oriented x 3. No asterixis Psychological normal mood and affect.  Assessment and Plan:  Problem #66 67 year old white male with stable sclerosing cholangitis followed at [facility]. His next appointment is due in September 2014. We are repeating his liver function tests today as well as alpha-fetoprotein, PSA level, testosterone level and CBC.  Problem #2 Chronic Crohn's colitis in remission. Patient is to continue 6-MP 25 mg a day and mesalamine 2.4 g daily. A recall colonoscopy will be due in October 2015.  Problem #4 Decreased level of energy possibly due to chronic liver disease and decreased testosterone levels. We will draw a baseline testosterone today and start him on testosterone 1.62% gel 40.5 mg  daily. He will need a recheck testosterone level in 3 months.  Problem #5 Obesity. Again, patient has been counseled as far as weight loss and reducing his alcohol intake is concerned.   04/13/2012 [name]

## 2012-04-14 LAB — PSA: PSA: 1.47 ng/mL (ref 0.10–4.00)

## 2012-04-14 LAB — HEPATIC FUNCTION PANEL
Albumin: 3.7 g/dL (ref 3.5–5.2)
Bilirubin, Direct: 0.1 mg/dL (ref 0.0–0.3)
Total Protein: 7.6 g/dL (ref 6.0–8.3)

## 2012-04-14 LAB — TESTOSTERONE: Testosterone: 278.33 ng/dL — ABNORMAL LOW (ref 350.00–890.00)

## 2012-04-14 LAB — AFP TUMOR MARKER: AFP-Tumor Marker: 9.2 ng/mL — ABNORMAL HIGH (ref 0.0–8.0)

## 2012-04-19 ENCOUNTER — Other Ambulatory Visit: Payer: Self-pay | Admitting: *Deleted

## 2012-04-19 DIAGNOSIS — K5190 Ulcerative colitis, unspecified, without complications: Secondary | ICD-10-CM

## 2012-05-06 DIAGNOSIS — L408 Other psoriasis: Secondary | ICD-10-CM | POA: Diagnosis not present

## 2012-05-10 DIAGNOSIS — L408 Other psoriasis: Secondary | ICD-10-CM | POA: Diagnosis not present

## 2012-05-12 ENCOUNTER — Other Ambulatory Visit: Payer: Self-pay | Admitting: Internal Medicine

## 2012-05-13 DIAGNOSIS — L408 Other psoriasis: Secondary | ICD-10-CM | POA: Diagnosis not present

## 2012-05-17 DIAGNOSIS — S058X9A Other injuries of unspecified eye and orbit, initial encounter: Secondary | ICD-10-CM | POA: Diagnosis not present

## 2012-05-17 DIAGNOSIS — H1130 Conjunctival hemorrhage, unspecified eye: Secondary | ICD-10-CM | POA: Diagnosis not present

## 2012-05-17 DIAGNOSIS — L408 Other psoriasis: Secondary | ICD-10-CM | POA: Diagnosis not present

## 2012-05-20 DIAGNOSIS — L408 Other psoriasis: Secondary | ICD-10-CM | POA: Diagnosis not present

## 2012-05-26 DIAGNOSIS — L408 Other psoriasis: Secondary | ICD-10-CM | POA: Diagnosis not present

## 2012-05-28 DIAGNOSIS — L408 Other psoriasis: Secondary | ICD-10-CM | POA: Diagnosis not present

## 2012-06-07 DIAGNOSIS — L408 Other psoriasis: Secondary | ICD-10-CM | POA: Diagnosis not present

## 2012-06-09 DIAGNOSIS — L408 Other psoriasis: Secondary | ICD-10-CM | POA: Diagnosis not present

## 2012-06-15 DIAGNOSIS — L408 Other psoriasis: Secondary | ICD-10-CM | POA: Diagnosis not present

## 2012-06-16 ENCOUNTER — Other Ambulatory Visit: Payer: Self-pay | Admitting: Internal Medicine

## 2012-06-17 DIAGNOSIS — L408 Other psoriasis: Secondary | ICD-10-CM | POA: Diagnosis not present

## 2012-06-21 DIAGNOSIS — L408 Other psoriasis: Secondary | ICD-10-CM | POA: Diagnosis not present

## 2012-06-22 ENCOUNTER — Other Ambulatory Visit: Payer: Self-pay | Admitting: Internal Medicine

## 2012-06-23 DIAGNOSIS — L408 Other psoriasis: Secondary | ICD-10-CM | POA: Diagnosis not present

## 2012-06-28 DIAGNOSIS — L408 Other psoriasis: Secondary | ICD-10-CM | POA: Diagnosis not present

## 2012-07-02 DIAGNOSIS — L408 Other psoriasis: Secondary | ICD-10-CM | POA: Diagnosis not present

## 2012-07-06 DIAGNOSIS — L408 Other psoriasis: Secondary | ICD-10-CM | POA: Diagnosis not present

## 2012-07-08 DIAGNOSIS — L408 Other psoriasis: Secondary | ICD-10-CM | POA: Diagnosis not present

## 2012-07-12 DIAGNOSIS — L408 Other psoriasis: Secondary | ICD-10-CM | POA: Diagnosis not present

## 2012-07-15 ENCOUNTER — Telehealth: Payer: Self-pay | Admitting: *Deleted

## 2012-07-15 DIAGNOSIS — L408 Other psoriasis: Secondary | ICD-10-CM | POA: Diagnosis not present

## 2012-07-15 NOTE — Telephone Encounter (Signed)
Spoke with patient and reminded him of lab work for next week.

## 2012-07-15 NOTE — Telephone Encounter (Signed)
Message copied by Hulan Saas on Thu Jul 15, 2012 10:16 AM ------      Message from: Hulan Saas      Created: Tue Apr 20, 2012  1:09 PM       Call and remind patient due for AFP and testostrerone for DB ------

## 2012-07-15 NOTE — Telephone Encounter (Signed)
Left a message for patient to call me. 

## 2012-07-19 ENCOUNTER — Other Ambulatory Visit (INDEPENDENT_AMBULATORY_CARE_PROVIDER_SITE_OTHER): Payer: Medicare Other

## 2012-07-19 DIAGNOSIS — K5190 Ulcerative colitis, unspecified, without complications: Secondary | ICD-10-CM

## 2012-07-19 DIAGNOSIS — L408 Other psoriasis: Secondary | ICD-10-CM | POA: Diagnosis not present

## 2012-07-19 LAB — TESTOSTERONE: Testosterone: 264.6 ng/dL — ABNORMAL LOW (ref 350.00–890.00)

## 2012-07-20 ENCOUNTER — Other Ambulatory Visit: Payer: Self-pay | Admitting: *Deleted

## 2012-07-20 DIAGNOSIS — K5010 Crohn's disease of large intestine without complications: Secondary | ICD-10-CM

## 2012-07-26 DIAGNOSIS — L408 Other psoriasis: Secondary | ICD-10-CM | POA: Diagnosis not present

## 2012-07-28 ENCOUNTER — Ambulatory Visit (INDEPENDENT_AMBULATORY_CARE_PROVIDER_SITE_OTHER): Payer: Medicare Other | Admitting: Internal Medicine

## 2012-07-28 ENCOUNTER — Encounter: Payer: Self-pay | Admitting: Internal Medicine

## 2012-07-28 VITALS — BP 160/90 | HR 72 | Temp 98.0°F | Resp 12 | Ht 72.0 in | Wt 238.0 lb

## 2012-07-28 DIAGNOSIS — E291 Testicular hypofunction: Secondary | ICD-10-CM | POA: Diagnosis not present

## 2012-07-28 MED ORDER — TESTOSTERONE CYPIONATE 200 MG/ML IM SOLN: 400.0000 mg | INTRAMUSCULAR | Status: DC

## 2012-07-28 NOTE — Patient Instructions (Addendum)
Testosterone deficiency - has not had rise in serum level of testoerone on androgel pump. Discussed risks and benefits of replacement including small and unproven cardiovascular risk and the risk of hormonal stimulation of prostate malignancy (PSA nl 2012, 2014).  Plan Will start testosterone cypionate IM injections monthly  Follow up level in a couple of months that is timed to be done 7-10 after injection.

## 2012-07-28 NOTE — Assessment & Plan Note (Signed)
No improvement in testosterone after 1 month of androgel pump.  Plan Testosterone cypionate 400 mg IM monthly  F/u lab in 2n or 3rd week after injection

## 2012-07-28 NOTE — Progress Notes (Signed)
  Subjective:    Patient ID: Mark [name], male    DOB: 08-16-1945, 67 y.o.   MRN: [id number]  HPI Testosterone deficiency with level at 264. He has tried androgel pump with no improvement in testosterone level thus the referral for alternative approach. He is willing to try injection therapy.  PMH, FamHx and SocHx reviewed for any changes and relevance. Current Outpatient Prescriptions on File Prior to Visit  Medication Sig Dispense Refill  . Ascorbic Acid (VITAMIN C PO) Take 1 capsule by mouth daily.        Marland Kitchen b complex vitamins tablet Take 1 tablet by mouth daily.        . Calcium Carbonate-Vitamin D (CALCIUM + D PO) Take 1 capsule by mouth 2 (two) times daily.        . mercaptopurine (PURINETHOL) 50 MG tablet Take 1/2 tablet by mouth once daily  135 tablet  3  . Mesalamine (DELZICOL) 400 MG CPDR Take 3 capsules (1,200 mg total) by mouth 2 (two) times daily.  540 capsule  3  . Multiple Vitamins-Minerals (CENTRUM PO) Take 1 tablet by mouth daily.        . Omega-3 Fatty Acids (FISH OIL CONCENTRATE PO) Take 1 capsule by mouth daily.        Marland Kitchen VITAMIN A PO Take 1 capsule by mouth daily.        . Vitamin Mixture (VITAMIN E COMPLETE PO) Take 1 capsule by mouth daily.         No current facility-administered medications on file prior to visit.      Review of Systems System review is negative for any constitutional, cardiac, pulmonary, GI or neuro symptoms or complaints other than as described in the HPI.      Objective:   Physical Exam Filed Vitals:   07/28/12 1059  BP: 160/90  Pulse: 72  Temp: 98 F (36.7 C)  Resp: 12   Gen'l - WNWD overweight white man in no distress HEENT_ C&S clear Cor - RRR Pulm - normal respirations Neuro - A&O x 3       Assessment & Plan:

## 2012-07-29 DIAGNOSIS — L408 Other psoriasis: Secondary | ICD-10-CM | POA: Diagnosis not present

## 2012-08-02 DIAGNOSIS — L408 Other psoriasis: Secondary | ICD-10-CM | POA: Diagnosis not present

## 2012-08-03 ENCOUNTER — Ambulatory Visit (INDEPENDENT_AMBULATORY_CARE_PROVIDER_SITE_OTHER): Payer: Medicare Other

## 2012-08-03 DIAGNOSIS — E291 Testicular hypofunction: Secondary | ICD-10-CM

## 2012-08-03 MED ORDER — TESTOSTERONE CYPIONATE 200 MG/ML IM SOLN
400.0000 mg | Freq: Once | INTRAMUSCULAR | Status: AC
  Administered 2012-08-03: 400 mg via INTRAMUSCULAR

## 2012-08-09 DIAGNOSIS — L408 Other psoriasis: Secondary | ICD-10-CM | POA: Diagnosis not present

## 2012-08-12 DIAGNOSIS — L408 Other psoriasis: Secondary | ICD-10-CM | POA: Diagnosis not present

## 2012-08-16 DIAGNOSIS — L408 Other psoriasis: Secondary | ICD-10-CM | POA: Diagnosis not present

## 2012-08-19 DIAGNOSIS — L408 Other psoriasis: Secondary | ICD-10-CM | POA: Diagnosis not present

## 2012-08-24 DIAGNOSIS — L408 Other psoriasis: Secondary | ICD-10-CM | POA: Diagnosis not present

## 2012-08-26 DIAGNOSIS — L408 Other psoriasis: Secondary | ICD-10-CM | POA: Diagnosis not present

## 2012-08-30 DIAGNOSIS — L408 Other psoriasis: Secondary | ICD-10-CM | POA: Diagnosis not present

## 2012-08-31 ENCOUNTER — Ambulatory Visit (INDEPENDENT_AMBULATORY_CARE_PROVIDER_SITE_OTHER): Payer: Medicare Other

## 2012-08-31 DIAGNOSIS — E291 Testicular hypofunction: Secondary | ICD-10-CM | POA: Diagnosis not present

## 2012-08-31 MED ORDER — TESTOSTERONE CYPIONATE 100 MG/ML IM SOLN
400.0000 mg | Freq: Once | INTRAMUSCULAR | Status: AC
  Administered 2012-08-31: 400 mg via INTRAMUSCULAR

## 2012-09-02 DIAGNOSIS — L408 Other psoriasis: Secondary | ICD-10-CM | POA: Diagnosis not present

## 2012-09-09 DIAGNOSIS — L408 Other psoriasis: Secondary | ICD-10-CM | POA: Diagnosis not present

## 2012-09-13 DIAGNOSIS — L408 Other psoriasis: Secondary | ICD-10-CM | POA: Diagnosis not present

## 2012-09-17 DIAGNOSIS — L408 Other psoriasis: Secondary | ICD-10-CM | POA: Diagnosis not present

## 2012-09-21 DIAGNOSIS — L408 Other psoriasis: Secondary | ICD-10-CM | POA: Diagnosis not present

## 2012-09-23 DIAGNOSIS — L408 Other psoriasis: Secondary | ICD-10-CM | POA: Diagnosis not present

## 2012-09-28 DIAGNOSIS — K8309 Other cholangitis: Secondary | ICD-10-CM | POA: Diagnosis not present

## 2012-09-28 DIAGNOSIS — K5190 Ulcerative colitis, unspecified, without complications: Secondary | ICD-10-CM | POA: Diagnosis not present

## 2012-09-28 DIAGNOSIS — L408 Other psoriasis: Secondary | ICD-10-CM | POA: Diagnosis not present

## 2012-09-29 ENCOUNTER — Ambulatory Visit (INDEPENDENT_AMBULATORY_CARE_PROVIDER_SITE_OTHER): Payer: Medicare Other

## 2012-09-29 ENCOUNTER — Other Ambulatory Visit: Payer: Self-pay | Admitting: Internal Medicine

## 2012-09-29 DIAGNOSIS — E291 Testicular hypofunction: Secondary | ICD-10-CM | POA: Diagnosis not present

## 2012-09-29 MED ORDER — TESTOSTERONE CYPIONATE 200 MG/ML IM SOLN
400.0000 mg | Freq: Once | INTRAMUSCULAR | Status: AC
  Administered 2012-09-29: 400 mg via INTRAMUSCULAR

## 2012-10-04 DIAGNOSIS — L408 Other psoriasis: Secondary | ICD-10-CM | POA: Diagnosis not present

## 2012-10-07 DIAGNOSIS — L408 Other psoriasis: Secondary | ICD-10-CM | POA: Diagnosis not present

## 2012-10-11 DIAGNOSIS — L408 Other psoriasis: Secondary | ICD-10-CM | POA: Diagnosis not present

## 2012-10-12 ENCOUNTER — Encounter: Payer: Self-pay | Admitting: Internal Medicine

## 2012-10-12 ENCOUNTER — Ambulatory Visit (INDEPENDENT_AMBULATORY_CARE_PROVIDER_SITE_OTHER): Payer: Medicare Other | Admitting: Internal Medicine

## 2012-10-12 VITALS — BP 170/104 | HR 84 | Ht 70.5 in | Wt 242.5 lb

## 2012-10-12 DIAGNOSIS — K8309 Other cholangitis: Secondary | ICD-10-CM

## 2012-10-12 DIAGNOSIS — K5010 Crohn's disease of large intestine without complications: Secondary | ICD-10-CM

## 2012-10-12 DIAGNOSIS — D849 Immunodeficiency, unspecified: Secondary | ICD-10-CM

## 2012-10-12 DIAGNOSIS — R7989 Other specified abnormal findings of blood chemistry: Secondary | ICD-10-CM | POA: Diagnosis not present

## 2012-10-12 NOTE — Progress Notes (Signed)
Mark [name] [birth year] MRN [id number]   History of Present Illness:  This is a 67 year old white male with Crohn's colitis since his 1999 colonoscopy. He had hemolytic anemia in 1999 which responded to steroids. 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 Tamala Julian at [facility]. A liver biopsy was completed in March 2005. He has yearly appointments at [facility]. His last appointment was 2 weeks ago. His disease is stable and his synthetic liver function has been stable. He did not need imaging of his liver this year. As far as his colitis is concerned, the last colonoscopy in October 2010 showed no active colitis but he had multiple pseudopolyps throughout the colon. He will be due for a recall colonoscopy in October 2015. He is currently having regular bowel movements and denies rectal bleeding or abdominal pain. He has been on 6-MP 25 mg daily and mesalamine (Delzicol 471m) 2.4 g daily   Past Medical History  Diagnosis Date  . Benign prostatic hypertrophy   . Psoriasis   . Cholelithiasis     asymptomatic  . Sclerosing cholangitis   . Hemolytic anemia   . Nephrolithiasis   . Crohn disease   . Abnormal LFTs    Past Surgical History  Procedure Laterality Date  . Hand surgery      cyst removal- left hand secondary to broken finger  . Stapedectomy    . Hammer toe surgery    . Lumbar laminectomy    . Dental implant      reports that he quit smoking about 15 years ago. He has never used smokeless tobacco. He reports that  drinks alcohol. He reports that he does not use illicit drugs. family history includes Breast cancer in an other family member; Clotting disorder in an other family member; Diabetes in his brother; Lung cancer in an other family member; Thyroid cancer in his mother. There is no history of Colon cancer or Lung cancer. Allergies  Allergen Reactions  . Penicillins     REACTION: hives  . Typhoid Vaccines         Review of Systems: Negative for abdominal pain,  rectal bleeding, diarrhea  The remainder of the 10 point ROS is negative except as outlined in H&P   Physical Exam: General appearance  Well developed, in no distress. Overweight Eyes- non icteric. HEENT nontraumatic, normocephalic. Mouth no lesions, tongue papillated, no cheilosis. Neck supple without adenopathy, thyroid not enlarged, no carotid bruits, no JVD. Lungs Clear to auscultation bilaterally. Cor normal S1, normal S2, regular rhythm, no murmur,  quiet precordium. Abdomen: Protuberant soft. Nontender. No ascites. No tympany. Liver edge at costal margin. Rectal: Soft Hemoccult negative stool. Extremities no pedal edema. Skin no lesions. Neurological alert and oriented x 3. No asterixis Psychological normal mood and affect.  Assessment and Plan:  Problem #[id number] old white male with inflammatory bowel disease which has been stable now for several years. We will reduce his mesalamine to 1.6 g daily. He will continue 6-MP at 25 mg daily. [facility] He will see me again in April 2015. [facility], we do not have to repeat any today. He will need a recall colonoscopy in October 2015.  Problem #2 Overweight. Again, I instructed him to reduce his weight and cut down on his alcohol consumption.   10/12/2012 DDelfin Edis

## 2012-10-12 NOTE — Patient Instructions (Addendum)
Please decrease Delzicol to 2 tablets twice daily.  Continue mercaptopurine 25 mg daily.  Please follow up with Dr Olevia Perches in 8 weeks.  CC: Dr [name], Dr Reginia Naas

## 2012-10-14 DIAGNOSIS — L408 Other psoriasis: Secondary | ICD-10-CM | POA: Diagnosis not present

## 2012-10-19 DIAGNOSIS — L408 Other psoriasis: Secondary | ICD-10-CM | POA: Diagnosis not present

## 2012-10-22 DIAGNOSIS — L408 Other psoriasis: Secondary | ICD-10-CM | POA: Diagnosis not present

## 2012-10-26 DIAGNOSIS — L408 Other psoriasis: Secondary | ICD-10-CM | POA: Diagnosis not present

## 2012-10-27 ENCOUNTER — Ambulatory Visit (INDEPENDENT_AMBULATORY_CARE_PROVIDER_SITE_OTHER): Payer: Medicare Other

## 2012-10-27 DIAGNOSIS — E291 Testicular hypofunction: Secondary | ICD-10-CM | POA: Diagnosis not present

## 2012-10-27 MED ORDER — TESTOSTERONE CYPIONATE 100 MG/ML IM SOLN
400.0000 mg | Freq: Once | INTRAMUSCULAR | Status: AC
  Administered 2012-10-27: 400 mg via INTRAMUSCULAR

## 2012-11-02 DIAGNOSIS — L408 Other psoriasis: Secondary | ICD-10-CM | POA: Diagnosis not present

## 2012-11-03 ENCOUNTER — Ambulatory Visit (INDEPENDENT_AMBULATORY_CARE_PROVIDER_SITE_OTHER): Payer: Medicare Other

## 2012-11-03 DIAGNOSIS — Z23 Encounter for immunization: Secondary | ICD-10-CM

## 2012-11-05 DIAGNOSIS — L408 Other psoriasis: Secondary | ICD-10-CM | POA: Diagnosis not present

## 2012-11-09 DIAGNOSIS — L408 Other psoriasis: Secondary | ICD-10-CM | POA: Diagnosis not present

## 2012-11-12 DIAGNOSIS — L408 Other psoriasis: Secondary | ICD-10-CM | POA: Diagnosis not present

## 2012-11-23 DIAGNOSIS — L408 Other psoriasis: Secondary | ICD-10-CM | POA: Diagnosis not present

## 2012-11-24 ENCOUNTER — Ambulatory Visit (INDEPENDENT_AMBULATORY_CARE_PROVIDER_SITE_OTHER): Payer: Medicare Other | Admitting: *Deleted

## 2012-11-24 DIAGNOSIS — E291 Testicular hypofunction: Secondary | ICD-10-CM | POA: Diagnosis not present

## 2012-11-24 MED ORDER — TESTOSTERONE CYPIONATE 200 MG/ML IM SOLN
400.0000 mg | Freq: Once | INTRAMUSCULAR | Status: AC
  Administered 2012-11-24: 400 mg via INTRAMUSCULAR

## 2012-11-25 DIAGNOSIS — L408 Other psoriasis: Secondary | ICD-10-CM | POA: Diagnosis not present

## 2012-11-29 DIAGNOSIS — L408 Other psoriasis: Secondary | ICD-10-CM | POA: Diagnosis not present

## 2012-12-01 DIAGNOSIS — L408 Other psoriasis: Secondary | ICD-10-CM | POA: Diagnosis not present

## 2012-12-01 DIAGNOSIS — L821 Other seborrheic keratosis: Secondary | ICD-10-CM | POA: Diagnosis not present

## 2012-12-06 DIAGNOSIS — L408 Other psoriasis: Secondary | ICD-10-CM | POA: Diagnosis not present

## 2012-12-23 DIAGNOSIS — L408 Other psoriasis: Secondary | ICD-10-CM | POA: Diagnosis not present

## 2012-12-24 ENCOUNTER — Ambulatory Visit (INDEPENDENT_AMBULATORY_CARE_PROVIDER_SITE_OTHER): Payer: Medicare Other

## 2012-12-24 DIAGNOSIS — E291 Testicular hypofunction: Secondary | ICD-10-CM

## 2012-12-24 MED ORDER — TESTOSTERONE CYPIONATE 200 MG/ML IM SOLN
400.0000 mg | Freq: Once | INTRAMUSCULAR | Status: AC
  Administered 2012-12-24: 400 mg via INTRAMUSCULAR

## 2013-01-10 DIAGNOSIS — L408 Other psoriasis: Secondary | ICD-10-CM | POA: Diagnosis not present

## 2013-01-13 DIAGNOSIS — L408 Other psoriasis: Secondary | ICD-10-CM | POA: Diagnosis not present

## 2013-01-18 DIAGNOSIS — L408 Other psoriasis: Secondary | ICD-10-CM | POA: Diagnosis not present

## 2013-01-20 ENCOUNTER — Ambulatory Visit (INDEPENDENT_AMBULATORY_CARE_PROVIDER_SITE_OTHER): Payer: Medicare Other

## 2013-01-20 DIAGNOSIS — E291 Testicular hypofunction: Secondary | ICD-10-CM | POA: Diagnosis not present

## 2013-01-20 MED ORDER — TESTOSTERONE CYPIONATE 200 MG/ML IM SOLN
400.0000 mg | Freq: Once | INTRAMUSCULAR | Status: AC
  Administered 2013-01-20: 400 mg via INTRAMUSCULAR

## 2013-01-25 DIAGNOSIS — L408 Other psoriasis: Secondary | ICD-10-CM | POA: Diagnosis not present

## 2013-01-27 DIAGNOSIS — L408 Other psoriasis: Secondary | ICD-10-CM | POA: Diagnosis not present

## 2013-02-01 DIAGNOSIS — L408 Other psoriasis: Secondary | ICD-10-CM | POA: Diagnosis not present

## 2013-02-03 DIAGNOSIS — L408 Other psoriasis: Secondary | ICD-10-CM | POA: Diagnosis not present

## 2013-02-09 DIAGNOSIS — L408 Other psoriasis: Secondary | ICD-10-CM | POA: Diagnosis not present

## 2013-02-11 DIAGNOSIS — L408 Other psoriasis: Secondary | ICD-10-CM | POA: Diagnosis not present

## 2013-02-14 DIAGNOSIS — L408 Other psoriasis: Secondary | ICD-10-CM | POA: Diagnosis not present

## 2013-02-17 ENCOUNTER — Other Ambulatory Visit: Payer: Self-pay

## 2013-02-17 ENCOUNTER — Ambulatory Visit (INDEPENDENT_AMBULATORY_CARE_PROVIDER_SITE_OTHER): Payer: Medicare Other

## 2013-02-17 DIAGNOSIS — E291 Testicular hypofunction: Secondary | ICD-10-CM | POA: Diagnosis not present

## 2013-02-17 MED ORDER — TESTOSTERONE CYPIONATE 200 MG/ML IM SOLN: INTRAMUSCULAR | Status: DC

## 2013-02-17 MED ORDER — TESTOSTERONE CYPIONATE 100 MG/ML IM SOLN
400.0000 mg | Freq: Once | INTRAMUSCULAR | Status: AC
  Administered 2013-02-17: 400 mg via INTRAMUSCULAR

## 2013-02-18 DIAGNOSIS — L408 Other psoriasis: Secondary | ICD-10-CM | POA: Diagnosis not present

## 2013-03-16 DIAGNOSIS — L408 Other psoriasis: Secondary | ICD-10-CM | POA: Diagnosis not present

## 2013-03-17 ENCOUNTER — Telehealth: Payer: Self-pay | Admitting: *Deleted

## 2013-03-17 ENCOUNTER — Ambulatory Visit (INDEPENDENT_AMBULATORY_CARE_PROVIDER_SITE_OTHER): Payer: Medicare Other | Admitting: *Deleted

## 2013-03-17 ENCOUNTER — Other Ambulatory Visit: Payer: Self-pay | Admitting: *Deleted

## 2013-03-17 DIAGNOSIS — E291 Testicular hypofunction: Secondary | ICD-10-CM | POA: Diagnosis not present

## 2013-03-17 MED ORDER — MESALAMINE 400 MG PO CPDR: DELAYED_RELEASE_CAPSULE | ORAL | Status: DC

## 2013-03-17 MED ORDER — TESTOSTERONE CYPIONATE 100 MG/ML IM SOLN
200.0000 mg | Freq: Once | INTRAMUSCULAR | Status: AC
  Administered 2013-03-17: 200 mg via INTRAMUSCULAR

## 2013-03-17 MED ORDER — MERCAPTOPURINE 50 MG PO TABS: ORAL_TABLET | ORAL | Status: DC

## 2013-03-17 NOTE — Telephone Encounter (Signed)
Patient came by office to ask for rx to be sent to [facility] [address] [employer] change. He also made his appointment for April for f/u OV.

## 2013-03-18 DIAGNOSIS — L408 Other psoriasis: Secondary | ICD-10-CM | POA: Diagnosis not present

## 2013-03-23 DIAGNOSIS — L408 Other psoriasis: Secondary | ICD-10-CM | POA: Diagnosis not present

## 2013-03-25 DIAGNOSIS — L408 Other psoriasis: Secondary | ICD-10-CM | POA: Diagnosis not present

## 2013-03-31 DIAGNOSIS — L408 Other psoriasis: Secondary | ICD-10-CM | POA: Diagnosis not present

## 2013-04-04 DIAGNOSIS — L408 Other psoriasis: Secondary | ICD-10-CM | POA: Diagnosis not present

## 2013-04-06 DIAGNOSIS — L408 Other psoriasis: Secondary | ICD-10-CM | POA: Diagnosis not present

## 2013-04-07 ENCOUNTER — Encounter: Payer: Self-pay | Admitting: Internal Medicine

## 2013-04-07 ENCOUNTER — Ambulatory Visit (INDEPENDENT_AMBULATORY_CARE_PROVIDER_SITE_OTHER): Payer: Medicare Other | Admitting: Internal Medicine

## 2013-04-07 ENCOUNTER — Telehealth: Payer: Self-pay | Admitting: Internal Medicine

## 2013-04-07 ENCOUNTER — Other Ambulatory Visit (INDEPENDENT_AMBULATORY_CARE_PROVIDER_SITE_OTHER): Payer: Medicare Other

## 2013-04-07 VITALS — BP 180/100 | HR 74 | Ht 70.5 in | Wt 241.0 lb

## 2013-04-07 DIAGNOSIS — D849 Immunodeficiency, unspecified: Secondary | ICD-10-CM

## 2013-04-07 DIAGNOSIS — K5010 Crohn's disease of large intestine without complications: Secondary | ICD-10-CM

## 2013-04-07 DIAGNOSIS — E291 Testicular hypofunction: Secondary | ICD-10-CM

## 2013-04-07 DIAGNOSIS — K8309 Other cholangitis: Secondary | ICD-10-CM

## 2013-04-07 LAB — CBC WITH DIFFERENTIAL/PLATELET
Basophils Absolute: 0 10*3/uL (ref 0.0–0.1)
Basophils Relative: 0.6 % (ref 0.0–3.0)
Eosinophils Absolute: 0.2 10*3/uL (ref 0.0–0.7)
Eosinophils Relative: 3.1 % (ref 0.0–5.0)
HCT: 47.8 % (ref 39.0–52.0)
HEMOGLOBIN: 16.1 g/dL (ref 13.0–17.0)
LYMPHS PCT: 10.7 % — AB (ref 12.0–46.0)
Lymphs Abs: 0.8 10*3/uL (ref 0.7–4.0)
MCHC: 33.6 g/dL (ref 30.0–36.0)
MCV: 91.9 fl (ref 78.0–100.0)
MONOS PCT: 14.3 % — AB (ref 3.0–12.0)
Monocytes Absolute: 1 10*3/uL (ref 0.1–1.0)
NEUTROS ABS: 5.1 10*3/uL (ref 1.4–7.7)
Neutrophils Relative %: 71.3 % (ref 43.0–77.0)
Platelets: 312 10*3/uL (ref 150.0–400.0)
RBC: 5.2 Mil/uL (ref 4.22–5.81)
RDW: 13.7 % (ref 11.5–14.6)
WBC: 7.2 10*3/uL (ref 4.5–10.5)

## 2013-04-07 LAB — TESTOSTERONE: Testosterone: 217.45 ng/dL — ABNORMAL LOW (ref 350.00–890.00)

## 2013-04-07 LAB — COMPREHENSIVE METABOLIC PANEL
ALBUMIN: 3.7 g/dL (ref 3.5–5.2)
ALK PHOS: 188 U/L — AB (ref 39–117)
ALT: 50 U/L (ref 0–53)
AST: 36 U/L (ref 0–37)
BUN: 9 mg/dL (ref 6–23)
CO2: 29 mEq/L (ref 19–32)
Calcium: 9.5 mg/dL (ref 8.4–10.5)
Chloride: 98 mEq/L (ref 96–112)
Creatinine, Ser: 0.9 mg/dL (ref 0.4–1.5)
GFR: 86.44 mL/min (ref >60.00)
Glucose, Bld: 91 mg/dL (ref 70–99)
POTASSIUM: 4.4 meq/L (ref 3.5–5.1)
SODIUM: 135 meq/L (ref 135–145)
TOTAL PROTEIN: 7.8 g/dL (ref 6.0–8.3)
Total Bilirubin: 0.7 mg/dL (ref 0.3–1.2)

## 2013-04-07 LAB — SEDIMENTATION RATE: SED RATE: 11 mm/h (ref 0–22)

## 2013-04-07 MED ORDER — MESALAMINE 400 MG PO CPDR: DELAYED_RELEASE_CAPSULE | ORAL | Status: DC

## 2013-04-07 MED ORDER — MERCAPTOPURINE 50 MG PO TABS: ORAL_TABLET | ORAL | Status: DC

## 2013-04-07 NOTE — Telephone Encounter (Signed)
Dr. Olevia Perches called request for Mark [name] to see Dr. Jenny Reichmann as PCP since [name] is retired. Please advise.

## 2013-04-07 NOTE — Telephone Encounter (Signed)
Ok with me  Mark [name] to offer appt

## 2013-04-07 NOTE — Progress Notes (Signed)
Mark [name] 08-Nov-1945 [id number]  Note: This dictation was prepared with Dragon digital system. Any transcriptional errors that result from this procedure are unintentional.   History of Present Illness:  This is a 68 year old white male with Crohn's colitis since 1999. He had hemolytic anemia in 1999 which responded to steroids and has not recurred.Marland Kitchen He is followed at [facility] by Dr. Tamala Julian for sclerosing cholangitis. He has been stable and asymptomatic on 6 MP 25 mg daily and Delzicol 400 mg 2 tablets twice a day. He denies diarrhea or rectal bleeding. He has not been able to lose any weight and his blood pressure today is 180/100.    Past Medical History  Diagnosis Date  . Benign prostatic hypertrophy   . Psoriasis   . Cholelithiasis     asymptomatic  . Sclerosing cholangitis   . Hemolytic anemia   . Nephrolithiasis   . Crohn disease   . Abnormal LFTs     Past Surgical History  Procedure Laterality Date  . Hand surgery      cyst removal- left hand secondary to broken finger  . Stapedectomy    . Hammer toe surgery    . Lumbar laminectomy    . Dental implant      Allergies  Allergen Reactions  . Penicillins     REACTION: hives  . Typhoid Vaccines     Family history and social history have been reviewed.  Review of Systems:   The remainder of the 10 point ROS is negative except as outlined in the H&P  Physical Exam: General Appearance Well developed, in no distress, overweight Psychological Normal mood and affect  Assessment and Plan:   Problem #1 Crohn's colitis and sclerosing cholangitis. Both diseases are currently stable. He will continue on 6-MP 25 mg daily and mesalamine 1.6 g daily. We will obtain CBC, liver function tests, and sedimentation rate today. He will be switching from Dr. [name] who has retired to Dr. Cathlean Cower. We will make an appointment for him as soon as possible because he needs to control his blood pressure. Problem #2 hypertension-  appointment made with Dr Jenny Reichmann Problem#3 Obesity, needs to lose weight- again discussed Problem #4 cholelithiasis- asymptomatic   Mark [name] 04/07/2013

## 2013-04-07 NOTE — Patient Instructions (Addendum)
Your physician has requested that you go to the basement for the following lab work before leaving today: CMET, CBC, Sed Rate  You have been scheduled for an appointment with Dr Jenny Reichmann on 04/13/13 @ 6:00pm.  We have sent the following medications to your pharmacy for you to pick up at your convenience: Delzicol 6MP  Please follow up with Dr Olevia Perches in 1 year.  You will be due for a recall colonoscopy in 10/2013. We will send you a reminder in the mail when it gets closer to that time.  CC: Dr Cathlean Cower, Dr Little Ishikawa  [facility]

## 2013-04-11 DIAGNOSIS — L408 Other psoriasis: Secondary | ICD-10-CM | POA: Diagnosis not present

## 2013-04-13 ENCOUNTER — Ambulatory Visit: Payer: TRICARE For Life (TFL) | Admitting: Internal Medicine

## 2013-04-13 DIAGNOSIS — L408 Other psoriasis: Secondary | ICD-10-CM | POA: Diagnosis not present

## 2013-04-14 ENCOUNTER — Ambulatory Visit (INDEPENDENT_AMBULATORY_CARE_PROVIDER_SITE_OTHER): Payer: Medicare Other | Admitting: *Deleted

## 2013-04-14 DIAGNOSIS — Z87898 Personal history of other specified conditions: Secondary | ICD-10-CM

## 2013-04-14 MED ORDER — TESTOSTERONE CYPIONATE 100 MG/ML IM SOLN
400.0000 mg | Freq: Once | INTRAMUSCULAR | Status: AC
  Administered 2013-04-14: 400 mg via INTRAMUSCULAR

## 2013-04-15 ENCOUNTER — Other Ambulatory Visit (INDEPENDENT_AMBULATORY_CARE_PROVIDER_SITE_OTHER): Payer: Medicare Other

## 2013-04-15 ENCOUNTER — Ambulatory Visit (INDEPENDENT_AMBULATORY_CARE_PROVIDER_SITE_OTHER): Payer: Medicare Other | Admitting: Internal Medicine

## 2013-04-15 ENCOUNTER — Encounter: Payer: Self-pay | Admitting: Internal Medicine

## 2013-04-15 VITALS — BP 160/88 | HR 83 | Temp 98.0°F | Ht 70.5 in | Wt 237.0 lb

## 2013-04-15 DIAGNOSIS — Z23 Encounter for immunization: Secondary | ICD-10-CM

## 2013-04-15 DIAGNOSIS — N320 Bladder-neck obstruction: Secondary | ICD-10-CM | POA: Diagnosis not present

## 2013-04-15 DIAGNOSIS — I10 Essential (primary) hypertension: Secondary | ICD-10-CM | POA: Diagnosis not present

## 2013-04-15 DIAGNOSIS — E785 Hyperlipidemia, unspecified: Secondary | ICD-10-CM | POA: Insufficient documentation

## 2013-04-15 DIAGNOSIS — R932 Abnormal findings on diagnostic imaging of liver and biliary tract: Secondary | ICD-10-CM | POA: Diagnosis not present

## 2013-04-15 DIAGNOSIS — E291 Testicular hypofunction: Secondary | ICD-10-CM | POA: Diagnosis not present

## 2013-04-15 DIAGNOSIS — K5010 Crohn's disease of large intestine without complications: Secondary | ICD-10-CM

## 2013-04-15 LAB — LIPID PANEL
CHOLESTEROL: 230 mg/dL — AB (ref 0–200)
HDL: 66.6 mg/dL (ref >39.00)
LDL Cholesterol: 147 mg/dL — ABNORMAL HIGH (ref 0–99)
Total CHOL/HDL Ratio: 3
Triglycerides: 80 mg/dL (ref 0.0–149.0)
VLDL: 16 mg/dL (ref 0.0–40.0)

## 2013-04-15 LAB — PSA: PSA: 2.1 ng/mL (ref 0.10–4.00)

## 2013-04-15 MED ORDER — ASPIRIN EC 81 MG PO TBEC: 81.0000 mg | DELAYED_RELEASE_TABLET | Freq: Every day | ORAL | Status: DC

## 2013-04-15 MED ORDER — IRBESARTAN 150 MG PO TABS
150.0000 mg | ORAL_TABLET | Freq: Every day | ORAL | Status: DC
Start: 2013-04-15 — End: 2013-05-17

## 2013-04-15 NOTE — Patient Instructions (Addendum)
You had the new Prevnar pneumonia shot today  Your EKG was OK today  Please take all new medication as prescribed - the avapro 150 mg per day  Please also start Aspirin 81 mg - 1 per day (coated only)  Please go to the LAB in the Basement (turn left off the elevator) for the tests to be done today - the PSA and lipids  You will be contacted by phone if any changes need to be made immediately.  Otherwise, you will receive a letter about your results with an explanation, but please check with MyChart first.  Please remember to sign up for MyChart if you have not done so, as this will be important to you in the future with finding out test results, communicating by private email, and scheduling acute appointments online when needed.  Please return in 1 months, or sooner if needed

## 2013-04-15 NOTE — Assessment & Plan Note (Signed)
Level testost borderline low, ok to cont dosing as is,  to f/u any worsening symptoms or concerns

## 2013-04-15 NOTE — Progress Notes (Signed)
Subjective:    Patient ID: Mark [name], male    DOB: 1945-10-26, 68 y.o.   MRN: [id number]  HPI Here for yearly f/u;  Overall doing ok;  Pt denies CP, worsening SOB, DOE, wheezing, orthopnea, PND, worsening LE edema, palpitations, dizziness or syncope.  Pt denies neurological change such as new headache, facial or extremity weakness.  Pt denies polydipsia, polyuria, or low sugar symptoms. Pt states overall good compliance with treatment and medications, good tolerability, and has been trying to follow lower cholesterol diet.  Pt denies worsening depressive symptoms, suicidal ideation or panic. No fever, night sweats, wt loss, loss of appetite, or other constitutional symptoms.  Pt states good ability with ADL's, has low fall risk, home safety reviewed and adequate, no other significant changes in hearing or vision, and only occasionally active with exercise. BP elevated  Has plan for colonoscopy per Dr Olevia Perches in October.  Although testost low, he is comfortable with current dosing and does not want further increase.   Past Medical History  Diagnosis Date  . Benign prostatic hypertrophy   . Psoriasis   . Cholelithiasis     asymptomatic  . Sclerosing cholangitis   . Hemolytic anemia   . Nephrolithiasis   . Crohn disease   . Abnormal LFTs    Past Surgical History  Procedure Laterality Date  . Hand surgery      cyst removal- left hand secondary to broken finger  . Stapedectomy    . Hammer toe surgery    . Lumbar laminectomy    . Dental implant      reports that he quit smoking about 16 years ago. He has never used smokeless tobacco. He reports that he drinks alcohol. He reports that he does not use illicit drugs. family history includes Breast cancer in an other family member; Clotting disorder in an other family member; Diabetes in his brother; Lung cancer in an other family member; Thyroid cancer in his mother. There is no history of Colon cancer or Lung cancer. Allergies  Allergen  Reactions  . Penicillins     REACTION: hives  . Typhoid Vaccines    Current Outpatient Prescriptions on File Prior to Visit  Medication Sig Dispense Refill  . Ascorbic Acid (VITAMIN C PO) Take 1 capsule by mouth daily.        Marland Kitchen b complex vitamins tablet Take 1 tablet by mouth daily.        . Calcium Carbonate-Vitamin D (CALCIUM + D PO) Take 1 capsule by mouth 2 (two) times daily.        . mercaptopurine (PURINETHOL) 50 MG tablet Take 1/2 tablet by mouth once daily  135 tablet  2  . Mesalamine (DELZICOL) 400 MG CPDR DR capsule Take 2 capsules BID  360 capsule  2  . Multiple Vitamins-Minerals (CENTRUM PO) Take 1 tablet by mouth daily.        . Omega-3 Fatty Acids (FISH OIL CONCENTRATE PO) Take 1 capsule by mouth daily.        Marland Kitchen testosterone cypionate (DEPOTESTOTERONE CYPIONATE) 200 MG/ML injection INJECT 2 ML INTO THE MUSCLE EVERY 28 DAYS  10 mL  0  . VITAMIN A PO Take 1 capsule by mouth daily.        . Vitamin Mixture (VITAMIN E COMPLETE PO) Take 1 capsule by mouth daily.         No current facility-administered medications on file prior to visit.    Review of Systems Constitutional: Negative for diaphoresis,  activity change, appetite change or unexpected weight change.  HENT: Negative for hearing loss, ear pain, facial swelling, mouth sores and neck stiffness.   Eyes: Negative for pain, redness and visual disturbance.  Respiratory: Negative for shortness of breath and wheezing.   Cardiovascular: Negative for chest pain and palpitations.  Gastrointestinal: Negative for diarrhea, blood in stool, abdominal distention or other pain Genitourinary: Negative for hematuria, flank pain or change in urine volume.  Musculoskeletal: Negative for myalgias and joint swelling.  Skin: Negative for color change and wound.  Neurological: Negative for syncope and numbness. other than noted Hematological: Negative for adenopathy.  Psychiatric/Behavioral: Negative for hallucinations, self-injury,  decreased concentration and agitation.      Objective:   Physical Exam BP 160/88  Pulse 83  Temp(Src) 98 F (36.7 C) (Oral)  Ht 5' 10.5" (1.791 m)  Wt 237 lb (107.502 kg)  BMI 33.51 kg/m2  SpO2 96% VS noted,  Constitutional: Pt is oriented to person, place, and time. Appears well-developed and well-nourished.  Head: Normocephalic and atraumatic.  Right Ear: External ear normal.  Left Ear: External ear normal.  Nose: Nose normal.  Mouth/Throat: Oropharynx is clear and moist.  Eyes: Conjunctivae and EOM are normal. Pupils are equal, round, and reactive to light.  Neck: Normal range of motion. Neck supple. No JVD present. No tracheal deviation present.  Cardiovascular: Normal rate, regular rhythm, normal heart sounds and intact distal pulses.   Pulmonary/Chest: Effort normal and breath sounds normal.  Abdominal: Soft. Bowel sounds are normal. There is no tenderness. No HSM  Musculoskeletal: Normal range of motion. Exhibits no edema.  Lymphadenopathy:  Has no cervical adenopathy.  Neurological: Pt is alert and oriented to person, place, and time. Pt has normal reflexes. No cranial nerve deficit.  Skin: Skin is warm and dry. No rash noted.  Psychiatric:  Has  normal mood and affect. Behavior is normal.     Assessment & Plan:

## 2013-04-15 NOTE — Assessment & Plan Note (Addendum)
ECG reviewed as per emr, uncontrolled, to start avapro 150 qd, start asa 81 qd,  to f/u any worsening symptoms or concerns, f/u lab 1 mo BP Readings from Last 3 Encounters:  04/15/13 160/88  04/07/13 180/100  10/12/12 170/104

## 2013-04-15 NOTE — Assessment & Plan Note (Signed)
Also for psa as he is due 

## 2013-04-15 NOTE — Assessment & Plan Note (Signed)
stable overall by history and exam, recent data reviewed with pt, and pt to continue medical treatment as before,  to f/u any worsening symptoms or concerns, for lab f/u today

## 2013-04-15 NOTE — Progress Notes (Signed)
Pre visit review using our clinic review tool, if applicable. No additional management support is needed unless otherwise documented below in the visit note. 

## 2013-04-16 LAB — AFP TUMOR MARKER: AFP-Tumor Marker: 9.9 ng/mL — ABNORMAL HIGH (ref 0.0–8.0)

## 2013-04-18 DIAGNOSIS — L408 Other psoriasis: Secondary | ICD-10-CM | POA: Diagnosis not present

## 2013-04-19 ENCOUNTER — Encounter: Payer: Self-pay | Admitting: Internal Medicine

## 2013-04-20 DIAGNOSIS — L408 Other psoriasis: Secondary | ICD-10-CM | POA: Diagnosis not present

## 2013-04-26 DIAGNOSIS — L408 Other psoriasis: Secondary | ICD-10-CM | POA: Diagnosis not present

## 2013-04-28 ENCOUNTER — Telehealth: Payer: Self-pay | Admitting: Internal Medicine

## 2013-04-28 DIAGNOSIS — L408 Other psoriasis: Secondary | ICD-10-CM | POA: Diagnosis not present

## 2013-04-28 MED ORDER — MESALAMINE 1.2 G PO TBEC: 2.4000 g | DELAYED_RELEASE_TABLET | Freq: Every day | ORAL | Status: DC

## 2013-04-28 NOTE — Telephone Encounter (Signed)
Patient brought a letter in from [facility]. They will no longer cover Delzicol at a reasonable cost without Mark [name] having first tried balsalazide, sulfasalazine, Apriso, Lialda or Pentasa. Please advise.

## 2013-04-28 NOTE — Telephone Encounter (Signed)
No problem, start Lialda 1.2 gm , 2 po qd, #60, 10 refills.

## 2013-04-28 NOTE — Telephone Encounter (Signed)
I have spoken to patient and advised of Dr Nichola Sizer recommendations. He verbalizes understanding and will begin Lialda after delzicol rx is finished.

## 2013-05-03 DIAGNOSIS — L408 Other psoriasis: Secondary | ICD-10-CM | POA: Diagnosis not present

## 2013-05-06 DIAGNOSIS — L408 Other psoriasis: Secondary | ICD-10-CM | POA: Diagnosis not present

## 2013-05-09 DIAGNOSIS — L408 Other psoriasis: Secondary | ICD-10-CM | POA: Diagnosis not present

## 2013-05-12 DIAGNOSIS — L408 Other psoriasis: Secondary | ICD-10-CM | POA: Diagnosis not present

## 2013-05-13 ENCOUNTER — Ambulatory Visit (INDEPENDENT_AMBULATORY_CARE_PROVIDER_SITE_OTHER): Payer: Medicare Other

## 2013-05-13 DIAGNOSIS — E291 Testicular hypofunction: Secondary | ICD-10-CM

## 2013-05-13 MED ORDER — TESTOSTERONE CYPIONATE 100 MG/ML IM SOLN
200.0000 mg | INTRAMUSCULAR | Status: DC
  Administered 2013-05-13: 200 mg via INTRAMUSCULAR

## 2013-05-16 DIAGNOSIS — L408 Other psoriasis: Secondary | ICD-10-CM | POA: Diagnosis not present

## 2013-05-17 ENCOUNTER — Encounter: Payer: Self-pay | Admitting: Internal Medicine

## 2013-05-17 ENCOUNTER — Ambulatory Visit (INDEPENDENT_AMBULATORY_CARE_PROVIDER_SITE_OTHER): Payer: Medicare Other | Admitting: Internal Medicine

## 2013-05-17 VITALS — BP 152/98 | HR 75 | Temp 98.3°F | Ht 70.5 in | Wt 240.4 lb

## 2013-05-17 DIAGNOSIS — E785 Hyperlipidemia, unspecified: Secondary | ICD-10-CM

## 2013-05-17 DIAGNOSIS — R972 Elevated prostate specific antigen [PSA]: Secondary | ICD-10-CM

## 2013-05-17 DIAGNOSIS — I10 Essential (primary) hypertension: Secondary | ICD-10-CM

## 2013-05-17 DIAGNOSIS — E291 Testicular hypofunction: Secondary | ICD-10-CM

## 2013-05-17 MED ORDER — IRBESARTAN 300 MG PO TABS: 300.0000 mg | ORAL_TABLET | Freq: Every day | ORAL | Status: DC

## 2013-05-17 NOTE — Assessment & Plan Note (Signed)
With mild increase last visit, will plan for f/u psa at 6 mo, hopefully less if off the testosterone, but for urology if psa further increased

## 2013-05-17 NOTE — Patient Instructions (Signed)
OK to increase the generic avapro to 300 mg per day  Please stop the testosterone for now  Please continue all other medications as before, and refills have been done if requested. Please have the pharmacy call with any other refills you may need.  Please continue your efforts at being more active, low cholesterol diet, and weight control.  Please keep your appointments with your specialists as you may have planned (your yearly [facility] visit)  Please return in 6 months, or sooner if needed; we will need to plan to repeat the PSA and other testing at that time

## 2013-05-17 NOTE — Assessment & Plan Note (Signed)
Improved and stable overall by history and exam, recent data reviewed with pt, and pt to increase the avapro to 300 qd,  to f/u any worsening symptoms or concerns BP Readings from Last 3 Encounters:  05/17/13 152/98  04/15/13 160/88  04/07/13 180/100

## 2013-05-17 NOTE — Progress Notes (Signed)
Subjective:    Patient ID: Mark [name], male    DOB: Mar 06, 1945, 68 y.o.   MRN: [id number]  HPI  Here to f/u, BP still mild elevated  - Pt denies chest pain, increased sob or doe, wheezing, orthopnea, PND, increased LE swelling, palpitations, dizziness or syncope.  Pt denies polydipsia, polyuria, .  Pt denies new neurological symptoms such as new headache, or facial or extremity weakness or numbness.   Pt states overall good compliance with meds, has been trying to follow lower cholesterol diet, with wt overall stable.  Has been on low dose testosterone tx with good compliance for almost 1 yr (androgel, then testosterone shots).  PSA mild elevated last visit however. Denies urinary symptoms such as dysuria, frequency, urgency, flank pain, hematuria or n/v, fever, chills.  Sees [facility] yearly now.  Past Medical History  Diagnosis Date  . Benign prostatic hypertrophy   . Psoriasis   . Cholelithiasis     asymptomatic  . Sclerosing cholangitis   . Hemolytic anemia   . Nephrolithiasis   . Crohn disease   . Abnormal LFTs    Past Surgical History  Procedure Laterality Date  . Hand surgery      cyst removal- left hand secondary to broken finger  . Stapedectomy    . Hammer toe surgery    . Lumbar laminectomy    . Dental implant      reports that he quit smoking about 16 years ago. He has never used smokeless tobacco. He reports that he drinks alcohol. He reports that he does not use illicit drugs. family history includes Breast cancer in an other family member; Clotting disorder in an other family member; Diabetes in his brother; Lung cancer in an other family member; Thyroid cancer in his mother. There is no history of Colon cancer or Lung cancer. Allergies  Allergen Reactions  . Penicillins     REACTION: hives  . Typhoid Vaccines    Current Outpatient Prescriptions on File Prior to Visit  Medication Sig Dispense Refill  . Ascorbic Acid (VITAMIN C PO) Take 1 capsule by mouth  daily.        Marland Kitchen aspirin EC 81 MG tablet Take 1 tablet (81 mg total) by mouth daily.  90 tablet  11  . b complex vitamins tablet Take 1 tablet by mouth daily.        . Calcium Carbonate-Vitamin D (CALCIUM + D PO) Take 1 capsule by mouth 2 (two) times daily.        . mercaptopurine (PURINETHOL) 50 MG tablet Take 1/2 tablet by mouth once daily  135 tablet  2  . mesalamine (LIALDA) 1.2 G EC tablet Take 2 tablets (2.4 g total) by mouth daily with breakfast.  60 tablet  9  . Multiple Vitamins-Minerals (CENTRUM PO) Take 1 tablet by mouth daily.        . Omega-3 Fatty Acids (FISH OIL CONCENTRATE PO) Take 1 capsule by mouth daily.        Marland Kitchen VITAMIN A PO Take 1 capsule by mouth daily.        . Vitamin Mixture (VITAMIN E COMPLETE PO) Take 1 capsule by mouth daily.         No current facility-administered medications on file prior to visit.   Review of Systems  Constitutional: Negative for unusual diaphoresis or other sweats  HENT: Negative for ringing in ear Eyes: Negative for double vision or worsening visual disturbance.  Respiratory: Negative for choking and  stridor.   Gastrointestinal: Negative for vomiting or other signifcant bowel change Genitourinary: Negative for hematuria or decreased urine volume.  Musculoskeletal: Negative for other MSK pain or swelling Skin: Negative for color change and worsening wound.  Neurological: Negative for tremors and numbness other than noted  Psychiatric/Behavioral: Negative for decreased concentration or agitation other than above       Objective:   Physical Exam BP 152/98  Pulse 75  Temp(Src) 98.3 F (36.8 C) (Oral)  Ht 5' 10.5" (1.791 m)  Wt 240 lb 6 oz (109.033 kg)  BMI 33.99 kg/m2  SpO2 96% VS noted, not ill appearing Constitutional: Pt appears well-developed, well-nourished.  HENT: Head: NCAT.  Right Ear: External ear normal.  Left Ear: External ear normal.  Eyes: . Pupils are equal, round, and reactive to light. Conjunctivae and EOM are  normal Neck: Normal range of motion. Neck supple.  Cardiovascular: Normal rate and regular rhythm.   Pulmonary/Chest: Effort normal and breath sounds normal.  Neurological: Pt is alert. Not confused , motor grossly intact Skin: Skin is warm. No rash Psychiatric: Pt behavior is normal. No agitation.     Assessment & Plan:

## 2013-05-17 NOTE — Assessment & Plan Note (Signed)
I have asked pt to stop testosterone replacement for now, as PSA has increased mildly last visit.  Consider re-start if PSA improved

## 2013-05-17 NOTE — Progress Notes (Signed)
Pre visit review using our clinic review tool, if applicable. No additional management support is needed unless otherwise documented below in the visit note. 

## 2013-05-17 NOTE — Assessment & Plan Note (Addendum)
For low chol diet, avoid statin for now due to hepatic dz Lab Results  Component Value Date   LDLCALC 147* 04/15/2013

## 2013-05-18 DIAGNOSIS — L408 Other psoriasis: Secondary | ICD-10-CM | POA: Diagnosis not present

## 2013-05-24 DIAGNOSIS — L408 Other psoriasis: Secondary | ICD-10-CM | POA: Diagnosis not present

## 2013-05-26 DIAGNOSIS — L408 Other psoriasis: Secondary | ICD-10-CM | POA: Diagnosis not present

## 2013-05-31 DIAGNOSIS — L408 Other psoriasis: Secondary | ICD-10-CM | POA: Diagnosis not present

## 2013-06-02 DIAGNOSIS — L408 Other psoriasis: Secondary | ICD-10-CM | POA: Diagnosis not present

## 2013-06-06 DIAGNOSIS — L408 Other psoriasis: Secondary | ICD-10-CM | POA: Diagnosis not present

## 2013-06-09 ENCOUNTER — Ambulatory Visit: Payer: Medicare Other

## 2013-06-09 DIAGNOSIS — L408 Other psoriasis: Secondary | ICD-10-CM | POA: Diagnosis not present

## 2013-06-13 DIAGNOSIS — L408 Other psoriasis: Secondary | ICD-10-CM | POA: Diagnosis not present

## 2013-06-16 DIAGNOSIS — L408 Other psoriasis: Secondary | ICD-10-CM | POA: Diagnosis not present

## 2013-06-20 DIAGNOSIS — L408 Other psoriasis: Secondary | ICD-10-CM | POA: Diagnosis not present

## 2013-06-23 DIAGNOSIS — L408 Other psoriasis: Secondary | ICD-10-CM | POA: Diagnosis not present

## 2013-06-28 DIAGNOSIS — L408 Other psoriasis: Secondary | ICD-10-CM | POA: Diagnosis not present

## 2013-06-30 DIAGNOSIS — L408 Other psoriasis: Secondary | ICD-10-CM | POA: Diagnosis not present

## 2013-07-04 DIAGNOSIS — L408 Other psoriasis: Secondary | ICD-10-CM | POA: Diagnosis not present

## 2013-07-07 DIAGNOSIS — L408 Other psoriasis: Secondary | ICD-10-CM | POA: Diagnosis not present

## 2013-07-11 DIAGNOSIS — L408 Other psoriasis: Secondary | ICD-10-CM | POA: Diagnosis not present

## 2013-07-14 ENCOUNTER — Telehealth: Payer: Self-pay | Admitting: *Deleted

## 2013-07-14 DIAGNOSIS — L408 Other psoriasis: Secondary | ICD-10-CM | POA: Diagnosis not present

## 2013-07-14 NOTE — Telephone Encounter (Signed)
Please call pt with stable labs . PSA slightly elevated, will need to be rechecked in 6 months ----- Message ----- From: Hulan Saas, RN Sent: 07/14/2013 8:39 AM To: Lafayette Dragon, MD

## 2013-07-15 NOTE — Telephone Encounter (Signed)
Per Dr. Olevia Perches, PCP has reviewed this with patient.

## 2013-07-18 DIAGNOSIS — L408 Other psoriasis: Secondary | ICD-10-CM | POA: Diagnosis not present

## 2013-07-21 DIAGNOSIS — L408 Other psoriasis: Secondary | ICD-10-CM | POA: Diagnosis not present

## 2013-07-25 DIAGNOSIS — L408 Other psoriasis: Secondary | ICD-10-CM | POA: Diagnosis not present

## 2013-07-28 DIAGNOSIS — L408 Other psoriasis: Secondary | ICD-10-CM | POA: Diagnosis not present

## 2013-08-01 DIAGNOSIS — L408 Other psoriasis: Secondary | ICD-10-CM | POA: Diagnosis not present

## 2013-08-04 DIAGNOSIS — L408 Other psoriasis: Secondary | ICD-10-CM | POA: Diagnosis not present

## 2013-08-08 DIAGNOSIS — L408 Other psoriasis: Secondary | ICD-10-CM | POA: Diagnosis not present

## 2013-08-11 DIAGNOSIS — L408 Other psoriasis: Secondary | ICD-10-CM | POA: Diagnosis not present

## 2013-08-15 DIAGNOSIS — L408 Other psoriasis: Secondary | ICD-10-CM | POA: Diagnosis not present

## 2013-08-22 DIAGNOSIS — L408 Other psoriasis: Secondary | ICD-10-CM | POA: Diagnosis not present

## 2013-08-25 DIAGNOSIS — L408 Other psoriasis: Secondary | ICD-10-CM | POA: Diagnosis not present

## 2013-08-29 DIAGNOSIS — L408 Other psoriasis: Secondary | ICD-10-CM | POA: Diagnosis not present

## 2013-09-01 DIAGNOSIS — L408 Other psoriasis: Secondary | ICD-10-CM | POA: Diagnosis not present

## 2013-09-02 ENCOUNTER — Encounter: Payer: Self-pay | Admitting: Internal Medicine

## 2013-09-13 DIAGNOSIS — L408 Other psoriasis: Secondary | ICD-10-CM | POA: Diagnosis not present

## 2013-09-15 DIAGNOSIS — L408 Other psoriasis: Secondary | ICD-10-CM | POA: Diagnosis not present

## 2013-09-19 DIAGNOSIS — L408 Other psoriasis: Secondary | ICD-10-CM | POA: Diagnosis not present

## 2013-09-21 ENCOUNTER — Encounter: Payer: Self-pay | Admitting: Internal Medicine

## 2013-09-22 DIAGNOSIS — L408 Other psoriasis: Secondary | ICD-10-CM | POA: Diagnosis not present

## 2013-10-03 DIAGNOSIS — L408 Other psoriasis: Secondary | ICD-10-CM | POA: Diagnosis not present

## 2013-10-06 DIAGNOSIS — L400 Psoriasis vulgaris: Secondary | ICD-10-CM | POA: Diagnosis not present

## 2013-10-10 DIAGNOSIS — L400 Psoriasis vulgaris: Secondary | ICD-10-CM | POA: Diagnosis not present

## 2013-10-13 DIAGNOSIS — L400 Psoriasis vulgaris: Secondary | ICD-10-CM | POA: Diagnosis not present

## 2013-10-17 DIAGNOSIS — K830 Cholangitis: Secondary | ICD-10-CM | POA: Diagnosis not present

## 2013-10-18 DIAGNOSIS — L400 Psoriasis vulgaris: Secondary | ICD-10-CM | POA: Diagnosis not present

## 2013-10-20 DIAGNOSIS — L400 Psoriasis vulgaris: Secondary | ICD-10-CM | POA: Diagnosis not present

## 2013-10-25 DIAGNOSIS — L400 Psoriasis vulgaris: Secondary | ICD-10-CM | POA: Diagnosis not present

## 2013-10-27 DIAGNOSIS — L400 Psoriasis vulgaris: Secondary | ICD-10-CM | POA: Diagnosis not present

## 2013-10-31 ENCOUNTER — Ambulatory Visit (AMBULATORY_SURGERY_CENTER): Payer: Self-pay | Admitting: *Deleted

## 2013-10-31 VITALS — Ht 70.5 in | Wt 242.0 lb

## 2013-10-31 DIAGNOSIS — K5010 Crohn's disease of large intestine without complications: Secondary | ICD-10-CM

## 2013-10-31 MED ORDER — MOVIPREP 100 G PO SOLR: ORAL | Status: DC

## 2013-10-31 NOTE — Progress Notes (Signed)
Patient denies any allergies to eggs or soy. Patient denies any problems with anesthesia/sedation. Patient denies any oxygen use at home and does not take any diet/weight loss medications. EMMI education assisgned to patient on colonoscopy, this was explained and instructions given to patient. 

## 2013-11-01 DIAGNOSIS — L400 Psoriasis vulgaris: Secondary | ICD-10-CM | POA: Diagnosis not present

## 2013-11-03 DIAGNOSIS — L400 Psoriasis vulgaris: Secondary | ICD-10-CM | POA: Diagnosis not present

## 2013-11-07 DIAGNOSIS — L400 Psoriasis vulgaris: Secondary | ICD-10-CM | POA: Diagnosis not present

## 2013-11-10 ENCOUNTER — Ambulatory Visit: Payer: Medicare Other

## 2013-11-10 ENCOUNTER — Ambulatory Visit (INDEPENDENT_AMBULATORY_CARE_PROVIDER_SITE_OTHER): Payer: Medicare Other

## 2013-11-10 DIAGNOSIS — Z23 Encounter for immunization: Secondary | ICD-10-CM

## 2013-11-10 DIAGNOSIS — L400 Psoriasis vulgaris: Secondary | ICD-10-CM | POA: Diagnosis not present

## 2013-11-14 DIAGNOSIS — L400 Psoriasis vulgaris: Secondary | ICD-10-CM | POA: Diagnosis not present

## 2013-11-17 DIAGNOSIS — L400 Psoriasis vulgaris: Secondary | ICD-10-CM | POA: Diagnosis not present

## 2013-11-18 ENCOUNTER — Ambulatory Visit (AMBULATORY_SURGERY_CENTER): Payer: Medicare Other | Admitting: Internal Medicine

## 2013-11-18 ENCOUNTER — Encounter: Payer: Self-pay | Admitting: Internal Medicine

## 2013-11-18 VITALS — BP 146/89 | HR 57 | Temp 97.5°F | Resp 25 | Ht 70.5 in | Wt 242.0 lb

## 2013-11-18 DIAGNOSIS — K5010 Crohn's disease of large intestine without complications: Secondary | ICD-10-CM | POA: Diagnosis not present

## 2013-11-18 DIAGNOSIS — K5090 Crohn's disease, unspecified, without complications: Secondary | ICD-10-CM | POA: Diagnosis not present

## 2013-11-18 DIAGNOSIS — I10 Essential (primary) hypertension: Secondary | ICD-10-CM | POA: Diagnosis not present

## 2013-11-18 DIAGNOSIS — D124 Benign neoplasm of descending colon: Secondary | ICD-10-CM | POA: Diagnosis not present

## 2013-11-18 DIAGNOSIS — D125 Benign neoplasm of sigmoid colon: Secondary | ICD-10-CM | POA: Diagnosis not present

## 2013-11-18 DIAGNOSIS — D120 Benign neoplasm of cecum: Secondary | ICD-10-CM | POA: Diagnosis not present

## 2013-11-18 DIAGNOSIS — E669 Obesity, unspecified: Secondary | ICD-10-CM | POA: Diagnosis not present

## 2013-11-18 DIAGNOSIS — D122 Benign neoplasm of ascending colon: Secondary | ICD-10-CM | POA: Diagnosis not present

## 2013-11-18 DIAGNOSIS — N400 Enlarged prostate without lower urinary tract symptoms: Secondary | ICD-10-CM | POA: Diagnosis not present

## 2013-11-18 MED ORDER — SODIUM CHLORIDE 0.9 % IV SOLN: 500.0000 mL | INTRAVENOUS | Status: DC

## 2013-11-18 NOTE — Progress Notes (Signed)
Called to room to assist during endoscopic procedure.  Patient ID and intended procedure confirmed with present staff. Received instructions for my participation in the procedure from the performing physician.  

## 2013-11-18 NOTE — Progress Notes (Signed)
Procedure ends, to recovery, report given and VSS. 

## 2013-11-18 NOTE — Op Note (Signed)
[place]  [address] [place], [id number]   COLONOSCOPY PROCEDURE REPORT  PATIENT: Mark [name], Mark [name]  MR#: [id number] BIRTHDATE: 1945-07-13 , 59  yrs. old GENDER: male ENDOSCOPIST: Lafayette Dragon, MD REFERRED OT:RRNHA John, M.D. PROCEDURE DATE:  11/18/2013 PROCEDURE:   Colonoscopy with biopsy First Screening Colonoscopy - Avg.  risk and is 50 yrs.  old or older - No.  Prior Negative Screening - Now for repeat screening. N/A  History of Adenoma - Now for follow-up colonoscopy & has been > or = to 3 yrs.  N/A  Polyps Removed Today? No.  Polyps Removed Today? No.  Recommend repeat exam, <10 yrs? Polyps Removed Today? No.  Recommend repeat exam, <10 yrs? Yes.  Polyps Removed Today? No.  Recommend repeat exam, <10 yrs? Yes.  High risk (family or personal hx). ASA CLASS:   Class II INDICATIONS:Crohn's colitis 1999.  Sclerosing cholangitis.  Prior colonoscopy in 2005 and in November 2010 showed multiple pseudopolyps.  Currently in remission. MEDICATIONS: Monitored anesthesia care and Propofol 350 mg IV  DESCRIPTION OF PROCEDURE:   After the risks benefits and alternatives of the procedure were thoroughly explained, informed consent was obtained.  The digital rectal exam revealed no abnormalities of the rectum.   The LB [id number]-[id number]  endoscope was introduced through the anus and advanced to the cecum, which was identified by both the appendix and ileocecal valve. No adverse events experienced.   The quality of the prep was good, using MoviPrep  The instrument was then slowly withdrawn as the colon was fully examined.      COLON FINDINGS: Bretta Bang were multiple pseudopolyps throughout the colon starting at 30 cm from the rectum there was rectal sparing. No evidence of rectal disease.  There were no aphthous ulcers or any sign of active Crohn's colitis.  There were large pseudopolyps throughout the descending, transverse and ascending colon all the way to the  cecal pouch.  Ileocecal valve appeared normal.  Was made to enter terminal ileum but I was not successful.  There was no evidence of fall stricture.  Multiple biopsies were obtained from the right colon, descending colon and from the sigmoid colon. There was mild diverticulosis noted in the sigmoid colon. Retroflexed views revealed no abnormalities. The time to cecum=3 minutes 00 seconds.  Withdrawal time=9 minutes 44 seconds.  The scope was withdrawn and the procedure completed. COMPLICATIONS: There were no immediate complications.  ENDOSCOPIC IMPRESSION: 1.   Tthere were multiple pseudopolyps throughout the colon starting at 30 cm from the rectum there was rectal sparing.  No evidence of rectal Crohn's  disease.  There were no aphthous ulcers or any sign of active Crohn's colitis.  There were large pseudopolyps throughout the descending, transverse and ascending colon all the way to the cecal pouch.  Ileocecal valve appeared normal.  Unable to enter terminal ileum  There was no evidence of TI stricture. Multiple biopsies were obtained from the right colon, descending colon and from the sigmoid colon 2.   Mild diverticulosis was noted in the sigmoid colon  RECOMMENDATIONS: 1.  Await pathology results 2.  Continue current medications 3.  Recall colonoscopy in 5 years  eSigned:  Lafayette Dragon, MD 11/18/2013 9:06 AM   cc:   PATIENT NAME:  Mark [name], Mark [name] MR#: [id number]

## 2013-11-18 NOTE — Patient Instructions (Signed)
YOU HAD AN ENDOSCOPIC PROCEDURE TODAY AT [facility]: Refer to the procedure report that was given to you for any specific questions about what was found during the examination.  If the procedure report does not answer your questions, please call your gastroenterologist to clarify.  If you requested that your care partner not be given the details of your procedure findings, then the procedure report has been included in a sealed envelope for you to review at your convenience later.  YOU SHOULD EXPECT: Some feelings of bloating in the abdomen. Passage of more gas than usual.  Walking can help get rid of the air that was put into your GI tract during the procedure and reduce the bloating. If you had a lower endoscopy (such as a colonoscopy or flexible sigmoidoscopy) you may notice spotting of blood in your stool or on the toilet paper. If you underwent a bowel prep for your procedure, then you may not have a normal bowel movement for a few days.  DIET: Your first meal following the procedure should be a light meal and then it is ok to progress to your normal diet.  A half-sandwich or bowl of soup is an example of a good first meal.  Heavy or fried foods are harder to digest and may make you feel nauseous or bloated.  Likewise meals heavy in dairy and vegetables can cause extra gas to form and this can also increase the bloating.  Drink plenty of fluids but you should avoid alcoholic beverages for 24 hours.  ACTIVITY: Your care partner should take you home directly after the procedure.  You should plan to take it easy, moving slowly for the rest of the day.  You can resume normal activity the day after the procedure however you should NOT DRIVE or use heavy machinery for 24 hours (because of the sedation medicines used during the test).    SYMPTOMS TO REPORT IMMEDIATELY: A gastroenterologist can be reached at any hour.  During normal business hours, 8:30 AM to 5:00 PM Monday through Friday,  call [phone].  After hours and on weekends, please call the GI answering service at [phone] who will take a message and have the physician on call contact you.   Following lower endoscopy (colonoscopy or flexible sigmoidoscopy):  Excessive amounts of blood in the stool  Significant tenderness or worsening of abdominal pains  Swelling of the abdomen that is new, acute  Fever of 100F or higher   FOLLOW UP: If any biopsies were taken you will be contacted by phone or by letter within the next 1-3 weeks.  Call your gastroenterologist if you have not heard about the biopsies in 3 weeks.  Our staff will call the home number listed on your records the next business day following your procedure to check on you and address any questions or concerns that you may have at that time regarding the information given to you following your procedure. This is a courtesy call and so if there is no answer at the home number and we have not heard from you through the emergency physician on call, we will assume that you have returned to your regular daily activities without incident.  SIGNATURES/CONFIDENTIALITY: You and/or your care partner have signed paperwork which will be entered into your electronic medical record.  These signatures attest to the fact that that the information above on your After Visit Summary has been reviewed and is understood.  Full responsibility of the confidentiality of  this discharge information lies with you and/or your care-partner.  Polyp, diverticulosis information given.  Continue meds.  Next colonoscopy 5 years.

## 2013-11-21 ENCOUNTER — Telehealth: Payer: Self-pay | Admitting: *Deleted

## 2013-11-21 DIAGNOSIS — L400 Psoriasis vulgaris: Secondary | ICD-10-CM | POA: Diagnosis not present

## 2013-11-21 NOTE — Telephone Encounter (Signed)
Message left

## 2013-11-22 ENCOUNTER — Encounter: Payer: Self-pay | Admitting: Internal Medicine

## 2013-11-24 DIAGNOSIS — L400 Psoriasis vulgaris: Secondary | ICD-10-CM | POA: Diagnosis not present

## 2013-11-28 DIAGNOSIS — H43393 Other vitreous opacities, bilateral: Secondary | ICD-10-CM | POA: Diagnosis not present

## 2013-11-28 DIAGNOSIS — H2513 Age-related nuclear cataract, bilateral: Secondary | ICD-10-CM | POA: Diagnosis not present

## 2013-11-30 DIAGNOSIS — L400 Psoriasis vulgaris: Secondary | ICD-10-CM | POA: Diagnosis not present

## 2013-12-05 DIAGNOSIS — L400 Psoriasis vulgaris: Secondary | ICD-10-CM | POA: Diagnosis not present

## 2013-12-08 DIAGNOSIS — L400 Psoriasis vulgaris: Secondary | ICD-10-CM | POA: Diagnosis not present

## 2013-12-12 DIAGNOSIS — L400 Psoriasis vulgaris: Secondary | ICD-10-CM | POA: Diagnosis not present

## 2013-12-15 DIAGNOSIS — L400 Psoriasis vulgaris: Secondary | ICD-10-CM | POA: Diagnosis not present

## 2013-12-19 DIAGNOSIS — L400 Psoriasis vulgaris: Secondary | ICD-10-CM | POA: Diagnosis not present

## 2013-12-22 DIAGNOSIS — L400 Psoriasis vulgaris: Secondary | ICD-10-CM | POA: Diagnosis not present

## 2013-12-26 DIAGNOSIS — L400 Psoriasis vulgaris: Secondary | ICD-10-CM | POA: Diagnosis not present

## 2014-01-02 DIAGNOSIS — L400 Psoriasis vulgaris: Secondary | ICD-10-CM | POA: Diagnosis not present

## 2014-01-09 DIAGNOSIS — L400 Psoriasis vulgaris: Secondary | ICD-10-CM | POA: Diagnosis not present

## 2014-01-12 DIAGNOSIS — L400 Psoriasis vulgaris: Secondary | ICD-10-CM | POA: Diagnosis not present

## 2014-01-16 DIAGNOSIS — L400 Psoriasis vulgaris: Secondary | ICD-10-CM | POA: Diagnosis not present

## 2014-01-19 DIAGNOSIS — L400 Psoriasis vulgaris: Secondary | ICD-10-CM | POA: Diagnosis not present

## 2014-01-23 DIAGNOSIS — L400 Psoriasis vulgaris: Secondary | ICD-10-CM | POA: Diagnosis not present

## 2014-02-06 DIAGNOSIS — L400 Psoriasis vulgaris: Secondary | ICD-10-CM | POA: Diagnosis not present

## 2014-02-09 DIAGNOSIS — L400 Psoriasis vulgaris: Secondary | ICD-10-CM | POA: Diagnosis not present

## 2014-02-13 DIAGNOSIS — L400 Psoriasis vulgaris: Secondary | ICD-10-CM | POA: Diagnosis not present

## 2014-02-16 DIAGNOSIS — L400 Psoriasis vulgaris: Secondary | ICD-10-CM | POA: Diagnosis not present

## 2014-02-23 DIAGNOSIS — L400 Psoriasis vulgaris: Secondary | ICD-10-CM | POA: Diagnosis not present

## 2014-02-27 DIAGNOSIS — L400 Psoriasis vulgaris: Secondary | ICD-10-CM | POA: Diagnosis not present

## 2014-03-02 DIAGNOSIS — L400 Psoriasis vulgaris: Secondary | ICD-10-CM | POA: Diagnosis not present

## 2014-03-09 DIAGNOSIS — L400 Psoriasis vulgaris: Secondary | ICD-10-CM | POA: Diagnosis not present

## 2014-03-13 DIAGNOSIS — L400 Psoriasis vulgaris: Secondary | ICD-10-CM | POA: Diagnosis not present

## 2014-03-16 DIAGNOSIS — L400 Psoriasis vulgaris: Secondary | ICD-10-CM | POA: Diagnosis not present

## 2014-03-20 DIAGNOSIS — L400 Psoriasis vulgaris: Secondary | ICD-10-CM | POA: Diagnosis not present

## 2014-03-23 DIAGNOSIS — L400 Psoriasis vulgaris: Secondary | ICD-10-CM | POA: Diagnosis not present

## 2014-03-27 DIAGNOSIS — L400 Psoriasis vulgaris: Secondary | ICD-10-CM | POA: Diagnosis not present

## 2014-03-30 DIAGNOSIS — L400 Psoriasis vulgaris: Secondary | ICD-10-CM | POA: Diagnosis not present

## 2014-04-03 DIAGNOSIS — L400 Psoriasis vulgaris: Secondary | ICD-10-CM | POA: Diagnosis not present

## 2014-04-07 DIAGNOSIS — L400 Psoriasis vulgaris: Secondary | ICD-10-CM | POA: Diagnosis not present

## 2014-04-13 DIAGNOSIS — L400 Psoriasis vulgaris: Secondary | ICD-10-CM | POA: Diagnosis not present

## 2014-04-17 DIAGNOSIS — L400 Psoriasis vulgaris: Secondary | ICD-10-CM | POA: Diagnosis not present

## 2014-04-19 DIAGNOSIS — L400 Psoriasis vulgaris: Secondary | ICD-10-CM | POA: Diagnosis not present

## 2014-04-24 DIAGNOSIS — L400 Psoriasis vulgaris: Secondary | ICD-10-CM | POA: Diagnosis not present

## 2014-04-27 DIAGNOSIS — L400 Psoriasis vulgaris: Secondary | ICD-10-CM | POA: Diagnosis not present

## 2014-05-01 DIAGNOSIS — L400 Psoriasis vulgaris: Secondary | ICD-10-CM | POA: Diagnosis not present

## 2014-05-09 DIAGNOSIS — L400 Psoriasis vulgaris: Secondary | ICD-10-CM | POA: Diagnosis not present

## 2014-05-11 DIAGNOSIS — L400 Psoriasis vulgaris: Secondary | ICD-10-CM | POA: Diagnosis not present

## 2014-05-14 ENCOUNTER — Other Ambulatory Visit: Payer: Self-pay | Admitting: Internal Medicine

## 2014-05-15 DIAGNOSIS — L400 Psoriasis vulgaris: Secondary | ICD-10-CM | POA: Diagnosis not present

## 2014-05-17 ENCOUNTER — Other Ambulatory Visit: Payer: Self-pay | Admitting: Internal Medicine

## 2014-05-18 DIAGNOSIS — L400 Psoriasis vulgaris: Secondary | ICD-10-CM | POA: Diagnosis not present

## 2014-05-22 DIAGNOSIS — L400 Psoriasis vulgaris: Secondary | ICD-10-CM | POA: Diagnosis not present

## 2014-05-25 DIAGNOSIS — K830 Cholangitis: Secondary | ICD-10-CM | POA: Diagnosis not present

## 2014-05-25 DIAGNOSIS — K5190 Ulcerative colitis, unspecified, without complications: Secondary | ICD-10-CM | POA: Diagnosis not present

## 2014-05-25 DIAGNOSIS — K740 Hepatic fibrosis: Secondary | ICD-10-CM | POA: Diagnosis not present

## 2014-05-26 DIAGNOSIS — L400 Psoriasis vulgaris: Secondary | ICD-10-CM | POA: Diagnosis not present

## 2014-05-29 DIAGNOSIS — L400 Psoriasis vulgaris: Secondary | ICD-10-CM | POA: Diagnosis not present

## 2014-06-01 DIAGNOSIS — L400 Psoriasis vulgaris: Secondary | ICD-10-CM | POA: Diagnosis not present

## 2014-06-06 DIAGNOSIS — L400 Psoriasis vulgaris: Secondary | ICD-10-CM | POA: Diagnosis not present

## 2014-06-08 DIAGNOSIS — L400 Psoriasis vulgaris: Secondary | ICD-10-CM | POA: Diagnosis not present

## 2014-06-12 DIAGNOSIS — L309 Dermatitis, unspecified: Secondary | ICD-10-CM | POA: Diagnosis not present

## 2014-06-12 DIAGNOSIS — L400 Psoriasis vulgaris: Secondary | ICD-10-CM | POA: Diagnosis not present

## 2014-06-14 ENCOUNTER — Other Ambulatory Visit: Payer: Self-pay | Admitting: Internal Medicine

## 2014-06-15 DIAGNOSIS — L400 Psoriasis vulgaris: Secondary | ICD-10-CM | POA: Diagnosis not present

## 2014-06-19 DIAGNOSIS — L400 Psoriasis vulgaris: Secondary | ICD-10-CM | POA: Diagnosis not present

## 2014-06-22 DIAGNOSIS — L400 Psoriasis vulgaris: Secondary | ICD-10-CM | POA: Diagnosis not present

## 2014-06-26 DIAGNOSIS — L400 Psoriasis vulgaris: Secondary | ICD-10-CM | POA: Diagnosis not present

## 2014-06-27 ENCOUNTER — Other Ambulatory Visit (INDEPENDENT_AMBULATORY_CARE_PROVIDER_SITE_OTHER): Payer: Medicare Other

## 2014-06-27 ENCOUNTER — Encounter: Payer: Self-pay | Admitting: Internal Medicine

## 2014-06-27 ENCOUNTER — Ambulatory Visit (INDEPENDENT_AMBULATORY_CARE_PROVIDER_SITE_OTHER): Payer: Medicare Other | Admitting: Internal Medicine

## 2014-06-27 VITALS — BP 128/76 | HR 69 | Temp 97.7°F | Ht 71.0 in | Wt 231.0 lb

## 2014-06-27 DIAGNOSIS — I10 Essential (primary) hypertension: Secondary | ICD-10-CM

## 2014-06-27 DIAGNOSIS — E785 Hyperlipidemia, unspecified: Secondary | ICD-10-CM

## 2014-06-27 DIAGNOSIS — N320 Bladder-neck obstruction: Secondary | ICD-10-CM

## 2014-06-27 DIAGNOSIS — Z23 Encounter for immunization: Secondary | ICD-10-CM

## 2014-06-27 DIAGNOSIS — E291 Testicular hypofunction: Secondary | ICD-10-CM

## 2014-06-27 LAB — CBC WITH DIFFERENTIAL/PLATELET
BASOS PCT: 1.2 % (ref 0.0–3.0)
Basophils Absolute: 0.1 10*3/uL (ref 0.0–0.1)
EOS PCT: 8.1 % — AB (ref 0.0–5.0)
Eosinophils Absolute: 0.6 10*3/uL (ref 0.0–0.7)
HEMATOCRIT: 41.7 % (ref 39.0–52.0)
HEMOGLOBIN: 14.1 g/dL (ref 13.0–17.0)
LYMPHS ABS: 1.3 10*3/uL (ref 0.7–4.0)
Lymphocytes Relative: 16 % (ref 12.0–46.0)
MCHC: 33.9 g/dL (ref 30.0–36.0)
MCV: 90 fl (ref 78.0–100.0)
MONOS PCT: 12.6 % — AB (ref 3.0–12.0)
Monocytes Absolute: 1 10*3/uL (ref 0.1–1.0)
NEUTROS ABS: 4.9 10*3/uL (ref 1.4–7.7)
Neutrophils Relative %: 62.1 % (ref 43.0–77.0)
Platelets: 329 10*3/uL (ref 150.0–400.0)
RBC: 4.63 Mil/uL (ref 4.22–5.81)
RDW: 13.9 % (ref 11.5–15.5)
WBC: 7.8 10*3/uL (ref 4.0–10.5)

## 2014-06-27 LAB — HEPATIC FUNCTION PANEL
ALT: 41 U/L (ref 0–53)
AST: 29 U/L (ref 0–37)
Albumin: 3.8 g/dL (ref 3.5–5.2)
Alkaline Phosphatase: 185 U/L — ABNORMAL HIGH (ref 39–117)
BILIRUBIN DIRECT: 0.1 mg/dL (ref 0.0–0.3)
BILIRUBIN TOTAL: 0.6 mg/dL (ref 0.2–1.2)
Total Protein: 7.3 g/dL (ref 6.0–8.3)

## 2014-06-27 LAB — URINALYSIS, ROUTINE W REFLEX MICROSCOPIC
Bilirubin Urine: NEGATIVE
Hgb urine dipstick: NEGATIVE
Ketones, ur: NEGATIVE
LEUKOCYTES UA: NEGATIVE
NITRITE: NEGATIVE
PH: 7 (ref 5.0–8.0)
SPECIFIC GRAVITY, URINE: 1.01 (ref 1.000–1.030)
TOTAL PROTEIN, URINE-UPE24: NEGATIVE
Urine Glucose: NEGATIVE
Urobilinogen, UA: 0.2 (ref 0.0–1.0)
WBC UA: NONE SEEN — AB (ref 0–?)

## 2014-06-27 LAB — BASIC METABOLIC PANEL
BUN: 14 mg/dL (ref 6–23)
CHLORIDE: 101 meq/L (ref 96–112)
CO2: 31 meq/L (ref 19–32)
Calcium: 9.7 mg/dL (ref 8.4–10.5)
Creatinine, Ser: 0.81 mg/dL (ref 0.40–1.50)
GFR: 101 mL/min (ref >60.00)
GLUCOSE: 89 mg/dL (ref 70–99)
POTASSIUM: 4 meq/L (ref 3.5–5.1)
Sodium: 136 mEq/L (ref 135–145)

## 2014-06-27 LAB — LIPID PANEL
CHOLESTEROL: 214 mg/dL — AB (ref 0–200)
HDL: 54.5 mg/dL (ref >39.00)
LDL Cholesterol: 139 mg/dL — ABNORMAL HIGH (ref 0–99)
NonHDL: 159.5
Total CHOL/HDL Ratio: 4
Triglycerides: 102 mg/dL (ref 0.0–149.0)
VLDL: 20.4 mg/dL (ref 0.0–40.0)

## 2014-06-27 LAB — TSH: TSH: 1.31 u[IU]/mL (ref 0.35–4.50)

## 2014-06-27 LAB — PSA: PSA: 1.22 ng/mL (ref 0.10–4.00)

## 2014-06-27 MED ORDER — ASPIRIN 81 MG PO TBEC: 81.0000 mg | DELAYED_RELEASE_TABLET | Freq: Every day | ORAL | Status: DC

## 2014-06-27 MED ORDER — IRBESARTAN 300 MG PO TABS: 300.0000 mg | ORAL_TABLET | Freq: Every day | ORAL | Status: DC

## 2014-06-27 NOTE — Patient Instructions (Signed)
You had the Pneumovax shot today  Please continue all other medications as before, and refills have been done if requested.  Please have the pharmacy call with any other refills you may need.  Please continue your efforts at being more active, low cholesterol diet, and weight control.  You are otherwise up to date with prevention measures today.  Please keep your appointments with your specialists as you may have planned  Please go to the LAB in the Basement (turn left off the elevator) for the tests to be done today  You will be contacted by phone if any changes need to be made immediately.  Otherwise, you will receive a letter about your results with an explanation, but please check with MyChart first.  Please return in 1 year for your yearly visit, or sooner if needed

## 2014-06-27 NOTE — Assessment & Plan Note (Signed)
Also for psa as he is due 

## 2014-06-27 NOTE — Progress Notes (Signed)
Pre visit review using our clinic review tool, if applicable. No additional management support is needed unless otherwise documented below in the visit note. 

## 2014-06-27 NOTE — Assessment & Plan Note (Signed)
stable overall by history and exam, recent data reviewed with pt, and pt to continue medical treatment as before,  to f/u any worsening symptoms or concerns Lab Results  Component Value Date   LDLCALC 139* 06/27/2014   Goal ldl < 100, consider statin but declines last visit to take it

## 2014-06-27 NOTE — Progress Notes (Signed)
Subjective:    Patient ID: Mark [name], male    DOB: 06-27-1945, 69 y.o.   MRN: [id number]  HPI  Here for yearly f/u;  Overall doing ok;  Pt denies Chest pain, worsening SOB, DOE, wheezing, orthopnea, PND, worsening LE edema, palpitations, dizziness or syncope.  Pt denies neurological change such as new headache, facial or extremity weakness.  Pt denies polydipsia, polyuria, or low sugar symptoms. Pt states overall good compliance with treatment and medications, good tolerability, and has been trying to follow appropriate diet.  Pt denies worsening depressive symptoms, suicidal ideation or panic. No fever, night sweats, wt loss, loss of appetite, or other constitutional symptoms.  Pt states good ability with ADL's, has low fall risk, home safety reviewed and adequate, no other significant changes in hearing or vision, and only occasionally active with exercise. No current complaints Past Medical History  Diagnosis Date  . Benign prostatic hypertrophy   . Psoriasis   . Cholelithiasis     asymptomatic  . Sclerosing cholangitis   . Hemolytic anemia   . Nephrolithiasis   . Crohn disease   . Abnormal LFTs   . Hypertension    Past Surgical History  Procedure Laterality Date  . Hand surgery      cyst removal- left hand secondary to broken finger  . Stapedectomy    . Hammer toe surgery    . Lumbar laminectomy    . Dental implant    . Cyst removal with bone graft Right     reports that he quit smoking about 17 years ago. He has never used smokeless tobacco. He reports that he drinks about 38.5 oz of alcohol per week. He reports that he does not use illicit drugs. family history includes Breast cancer in an other family member; Clotting disorder in an other family member; Diabetes in his brother; Lung cancer in his father and another family member; Thyroid cancer in his mother. There is no history of Colon cancer. Allergies  Allergen Reactions  . Penicillins Hives    REACTION: hives    . Typhoid Vaccines Rash and Other (See Comments)    "fever"   Current Outpatient Prescriptions on File Prior to Visit  Medication Sig Dispense Refill  . Ascorbic Acid (VITAMIN C PO) Take 1 capsule by mouth daily.      Marland Kitchen b complex vitamins tablet Take 1 tablet by mouth daily.      . Calcium Carbonate-Vitamin D (CALCIUM + D PO) Take 1 capsule by mouth 2 (two) times daily.      . Iodoquinol-HC (HYDROCORTISONE-IODOQUINOL) 1-1 % CREA   3  . LIALDA 1.2 G EC tablet TAKE 2 TABLETS BY MOUTH DAILY WITH BREAKFAST 60 tablet 2  . mercaptopurine (PURINETHOL) 50 MG tablet Take 1/2 tablet by mouth once daily 135 tablet 2  . Multiple Vitamins-Minerals (CENTRUM PO) Take 1 tablet by mouth daily.      . Omega-3 Fatty Acids (FISH OIL CONCENTRATE PO) Take 1 capsule by mouth daily.      Marland Kitchen VITAMIN A PO Take 1 capsule by mouth daily.     . Vitamin Mixture (VITAMIN E COMPLETE PO) Take 1 capsule by mouth daily.      . mercaptopurine (PURINETHOL) 50 MG tablet TAKE 1/2 TABLET BY MOUTH ONCE DAILY (Patient not taking: Reported on 06/27/2014) 135 tablet 0   No current facility-administered medications on file prior to visit.   Review of Systems Constitutional: Negative for increased diaphoresis, other activity, appetite or siginficant weight  change other than noted HENT: Negative for worsening hearing loss, ear pain, facial swelling, mouth sores and neck stiffness.   Eyes: Negative for other worsening pain, redness or visual disturbance.  Respiratory: Negative for shortness of breath and wheezing  Cardiovascular: Negative for chest pain and palpitations.  Gastrointestinal: Negative for diarrhea, blood in stool, abdominal distention or other pain Genitourinary: Negative for hematuria, flank pain or change in urine volume.  Musculoskeletal: Negative for myalgias or other joint complaints.  Skin: Negative for color change and wound or drainage.  Neurological: Negative for syncope and numbness. other than  noted Hematological: Negative for adenopathy. or other swelling Psychiatric/Behavioral: Negative for hallucinations, SI, self-injury, decreased concentration or other worsening agitation.      Objective:   Physical Exam BP 128/76 mmHg  Pulse 69  Temp(Src) 97.7 F (36.5 C) (Oral)  Ht 5' 11"  (1.803 m)  Wt 231 lb (104.781 kg)  BMI 32.23 kg/m2  SpO2 91% VS noted,  Constitutional: Pt is oriented to person, place, and time. Appears well-developed and well-nourished, in no significant distress Head: Normocephalic and atraumatic.  Right Ear: External ear normal.  Left Ear: External ear normal.  Nose: Nose normal.  Mouth/Throat: Oropharynx is clear and moist.  Eyes: Conjunctivae and EOM are normal. Pupils are equal, round, and reactive to light.  Neck: Normal range of motion. Neck supple. No JVD present. No tracheal deviation present or significant neck LA or mass Cardiovascular: Normal rate, regular rhythm, normal heart sounds and intact distal pulses.   Pulmonary/Chest: Effort normal and breath sounds without rales or wheezing  Abdominal: Soft. Bowel sounds are normal. NT. No HSM  Musculoskeletal: Normal range of motion. Exhibits no edema.  Lymphadenopathy:  Has no cervical adenopathy.  Neurological: Pt is alert and oriented to person, place, and time. Pt has normal reflexes. No cranial nerve deficit. Motor grossly intact Skin: Skin is warm and dry. No rash noted.  Psychiatric:  Has normal mood and affect. Behavior is normal.      Assessment & Plan:

## 2014-06-27 NOTE — Assessment & Plan Note (Signed)
For testosterone check,  to f/u any worsening symptoms or concerns

## 2014-06-27 NOTE — Assessment & Plan Note (Signed)
stable overall by history and exam, recent data reviewed with pt, and pt to continue medical treatment as before,  to f/u any worsening symptoms or concerns BP Readings from Last 3 Encounters:  06/27/14 128/76  11/18/13 146/89  05/17/13 152/98

## 2014-06-29 ENCOUNTER — Encounter: Payer: Self-pay | Admitting: Internal Medicine

## 2014-06-29 DIAGNOSIS — L400 Psoriasis vulgaris: Secondary | ICD-10-CM | POA: Diagnosis not present

## 2014-07-04 ENCOUNTER — Ambulatory Visit (INDEPENDENT_AMBULATORY_CARE_PROVIDER_SITE_OTHER): Payer: Medicare Other | Admitting: Internal Medicine

## 2014-07-04 ENCOUNTER — Other Ambulatory Visit: Payer: Medicare Other

## 2014-07-04 ENCOUNTER — Encounter: Payer: Self-pay | Admitting: Internal Medicine

## 2014-07-04 VITALS — BP 118/64 | HR 70 | Ht 70.5 in | Wt 230.0 lb

## 2014-07-04 DIAGNOSIS — R7989 Other specified abnormal findings of blood chemistry: Secondary | ICD-10-CM | POA: Diagnosis not present

## 2014-07-04 DIAGNOSIS — K5010 Crohn's disease of large intestine without complications: Secondary | ICD-10-CM

## 2014-07-04 DIAGNOSIS — B86 Scabies: Secondary | ICD-10-CM | POA: Diagnosis not present

## 2014-07-04 DIAGNOSIS — R945 Abnormal results of liver function studies: Secondary | ICD-10-CM

## 2014-07-04 DIAGNOSIS — L400 Psoriasis vulgaris: Secondary | ICD-10-CM | POA: Diagnosis not present

## 2014-07-04 DIAGNOSIS — K830 Cholangitis: Secondary | ICD-10-CM

## 2014-07-04 DIAGNOSIS — K8309 Other cholangitis: Secondary | ICD-10-CM

## 2014-07-04 MED ORDER — MERCAPTOPURINE 50 MG PO TABS: ORAL_TABLET | ORAL | Status: DC

## 2014-07-04 MED ORDER — MESALAMINE 1.2 G PO TBEC: DELAYED_RELEASE_TABLET | ORAL | Status: DC

## 2014-07-04 NOTE — Progress Notes (Signed)
Mark [name] 07/28/1945 [id number]  Note: This dictation was prepared with Dragon digital system. Any transcriptional errors that result from this procedure are unintentional.   History of Present Illness: This is a 69 year old  patient with Crohn's colitis since 1999, sclerosing cholangitis, followed at [facility]. History of hemolytic anemia. Last colonoscopy in November 2015 show no evidence of active colitis. There was no dysplasia. There are multiple pseudopolyps. Prior colonoscopies  in 2005 and 2010. Alpha-fetoprotein is normal at 9.9. He has been on a Lialda 2.4 g daily and 6-MP 25 mg daily. His last liver function tests were satisfactory factory with the bilirubin of 0.9. Albumen 3.8. Alkaline phosphatase of 197, CA-19-9, 16 INR 1.0, AST 43 and ALT 60.He has stopped drinking alcohol completely  In  Last 3 months.    Past Medical History  Diagnosis Date  . Benign prostatic hypertrophy   . Psoriasis   . Cholelithiasis     asymptomatic  . Sclerosing cholangitis   . Hemolytic anemia   . Nephrolithiasis   . Crohn disease   . Abnormal LFTs   . Hypertension     Past Surgical History  Procedure Laterality Date  . Hand surgery      cyst removal- left hand secondary to broken finger  . Stapedectomy    . Hammer toe surgery    . Lumbar laminectomy    . Dental implant    . Cyst removal with bone graft Right     Allergies  Allergen Reactions  . Penicillins Hives    REACTION: hives  . Typhoid Vaccines Rash and Other (See Comments)    "fever"    Family history and social history have been reviewed.  Review of Systems: Denies abdominal pain, diarrhea, rectal bleeding. Nausea or vomiting  The remainder of the 10 point ROS is negative except as outlined in the H&P  Physical Exam: General Appearance Well developed, in no distress overweight Eyes  Non icteric  HEENT  Non traumatic, normocephalic  Mouth No lesion, tongue papillated, no cheilosis Neck Supple without adenopathy,  thyroid not enlarged, no carotid bruits, no JVD Lungs Clear to auscultation bilaterally COR Normal S1, normal S2, regular rhythm, no murmur, quiet precordium Abdomen protuberant but nontender. Normoactive bowel sounds. Liver edge at costal margin.  pruritic maculopapular rash on his upper extremities, abdomen and lower extremities. Extremities  No pedal edema Skin No lesions, see above Neurological Alert and oriented x 3 Psychological Normal mood and affect  Assessment and Plan:   69 year old white male with  Crohn's colitis, currently in remission by histology and by symptoms. He will remain on the same regimen of low-dose 6-MP combined with mesalamine 2.4 g daily. His next colonoscopy will be in November 2017.  Sclerosing cholangitis. Doing very well. Patient has stopped drinking alcohol entirely and has not had any intake  since March 2016. His liver function tests look the best they have looked . We will be checking hepatitis A antibody as well as hepatitis B and C serologies today. I will see him in 6 months.  Pruritic rash. He saw a dermatologist this morning who diagnosed scabies. He has instructions for treatment    [name] 07/04/2014

## 2014-07-04 NOTE — Patient Instructions (Addendum)
Your physician has requested that you go to the basement for lab work before leaving today.  We have sent the following medications to your pharmacy for you to pick up at your convenience:Lialda and 66m.     Dr JCathlean Cower Dr MGreta Doom

## 2014-07-05 LAB — HEPATITIS A ANTIBODY, TOTAL: HEP A TOTAL AB: NONREACTIVE

## 2014-07-05 LAB — HEPATITIS B SURFACE ANTIGEN: Hepatitis B Surface Ag: NEGATIVE

## 2014-07-05 LAB — HEPATITIS C ANTIBODY: HCV AB: NEGATIVE

## 2014-07-05 LAB — HEPATITIS B SURFACE ANTIBODY,QUALITATIVE: HEP B S AB: NEGATIVE

## 2014-07-06 ENCOUNTER — Other Ambulatory Visit: Payer: Self-pay | Admitting: *Deleted

## 2014-07-06 DIAGNOSIS — L400 Psoriasis vulgaris: Secondary | ICD-10-CM | POA: Diagnosis not present

## 2014-07-11 DIAGNOSIS — L400 Psoriasis vulgaris: Secondary | ICD-10-CM | POA: Diagnosis not present

## 2014-07-13 ENCOUNTER — Other Ambulatory Visit: Payer: Self-pay | Admitting: Internal Medicine

## 2014-07-14 DIAGNOSIS — L400 Psoriasis vulgaris: Secondary | ICD-10-CM | POA: Diagnosis not present

## 2014-07-17 ENCOUNTER — Ambulatory Visit (INDEPENDENT_AMBULATORY_CARE_PROVIDER_SITE_OTHER): Payer: Medicare Other | Admitting: Internal Medicine

## 2014-07-17 DIAGNOSIS — K5010 Crohn's disease of large intestine without complications: Secondary | ICD-10-CM

## 2014-07-17 DIAGNOSIS — L400 Psoriasis vulgaris: Secondary | ICD-10-CM | POA: Diagnosis not present

## 2014-07-17 DIAGNOSIS — Z23 Encounter for immunization: Secondary | ICD-10-CM | POA: Diagnosis not present

## 2014-07-18 DIAGNOSIS — L400 Psoriasis vulgaris: Secondary | ICD-10-CM | POA: Diagnosis not present

## 2014-07-20 DIAGNOSIS — L400 Psoriasis vulgaris: Secondary | ICD-10-CM | POA: Diagnosis not present

## 2014-07-24 DIAGNOSIS — L400 Psoriasis vulgaris: Secondary | ICD-10-CM | POA: Diagnosis not present

## 2014-07-27 DIAGNOSIS — L400 Psoriasis vulgaris: Secondary | ICD-10-CM | POA: Diagnosis not present

## 2014-07-31 DIAGNOSIS — L400 Psoriasis vulgaris: Secondary | ICD-10-CM | POA: Diagnosis not present

## 2014-08-03 DIAGNOSIS — L400 Psoriasis vulgaris: Secondary | ICD-10-CM | POA: Diagnosis not present

## 2014-08-07 DIAGNOSIS — L400 Psoriasis vulgaris: Secondary | ICD-10-CM | POA: Diagnosis not present

## 2014-08-10 DIAGNOSIS — L400 Psoriasis vulgaris: Secondary | ICD-10-CM | POA: Diagnosis not present

## 2014-08-14 ENCOUNTER — Other Ambulatory Visit: Payer: Self-pay | Admitting: Internal Medicine

## 2014-08-14 DIAGNOSIS — L400 Psoriasis vulgaris: Secondary | ICD-10-CM | POA: Diagnosis not present

## 2014-08-16 DIAGNOSIS — L400 Psoriasis vulgaris: Secondary | ICD-10-CM | POA: Diagnosis not present

## 2014-08-17 DIAGNOSIS — L281 Prurigo nodularis: Secondary | ICD-10-CM | POA: Diagnosis not present

## 2014-08-18 ENCOUNTER — Ambulatory Visit (INDEPENDENT_AMBULATORY_CARE_PROVIDER_SITE_OTHER): Payer: Medicare Other | Admitting: Internal Medicine

## 2014-08-18 DIAGNOSIS — K5010 Crohn's disease of large intestine without complications: Secondary | ICD-10-CM

## 2014-08-18 DIAGNOSIS — Z23 Encounter for immunization: Secondary | ICD-10-CM | POA: Diagnosis not present

## 2014-08-21 DIAGNOSIS — L400 Psoriasis vulgaris: Secondary | ICD-10-CM | POA: Diagnosis not present

## 2014-08-24 DIAGNOSIS — L400 Psoriasis vulgaris: Secondary | ICD-10-CM | POA: Diagnosis not present

## 2014-08-28 DIAGNOSIS — L400 Psoriasis vulgaris: Secondary | ICD-10-CM | POA: Diagnosis not present

## 2014-08-31 DIAGNOSIS — L400 Psoriasis vulgaris: Secondary | ICD-10-CM | POA: Diagnosis not present

## 2014-09-12 DIAGNOSIS — L400 Psoriasis vulgaris: Secondary | ICD-10-CM | POA: Diagnosis not present

## 2014-09-14 DIAGNOSIS — L400 Psoriasis vulgaris: Secondary | ICD-10-CM | POA: Diagnosis not present

## 2014-09-18 DIAGNOSIS — L400 Psoriasis vulgaris: Secondary | ICD-10-CM | POA: Diagnosis not present

## 2014-09-21 DIAGNOSIS — L400 Psoriasis vulgaris: Secondary | ICD-10-CM | POA: Diagnosis not present

## 2014-09-26 DIAGNOSIS — L400 Psoriasis vulgaris: Secondary | ICD-10-CM | POA: Diagnosis not present

## 2014-09-28 DIAGNOSIS — L400 Psoriasis vulgaris: Secondary | ICD-10-CM | POA: Diagnosis not present

## 2014-10-02 DIAGNOSIS — L400 Psoriasis vulgaris: Secondary | ICD-10-CM | POA: Diagnosis not present

## 2014-10-04 ENCOUNTER — Encounter: Payer: Self-pay | Admitting: Internal Medicine

## 2014-10-04 ENCOUNTER — Ambulatory Visit (INDEPENDENT_AMBULATORY_CARE_PROVIDER_SITE_OTHER): Payer: Medicare Other | Admitting: Internal Medicine

## 2014-10-04 VITALS — BP 128/82 | HR 64 | Temp 97.8°F | Ht 72.0 in | Wt 226.0 lb

## 2014-10-04 DIAGNOSIS — M1000 Idiopathic gout, unspecified site: Secondary | ICD-10-CM | POA: Diagnosis not present

## 2014-10-04 DIAGNOSIS — I10 Essential (primary) hypertension: Secondary | ICD-10-CM | POA: Diagnosis not present

## 2014-10-04 DIAGNOSIS — M79641 Pain in right hand: Secondary | ICD-10-CM

## 2014-10-04 DIAGNOSIS — E785 Hyperlipidemia, unspecified: Secondary | ICD-10-CM | POA: Diagnosis not present

## 2014-10-04 DIAGNOSIS — M109 Gout, unspecified: Secondary | ICD-10-CM | POA: Insufficient documentation

## 2014-10-04 MED ORDER — INDOMETHACIN 50 MG PO CAPS: 50.0000 mg | ORAL_CAPSULE | Freq: Three times a day (TID) | ORAL | Status: DC | PRN

## 2014-10-04 NOTE — Assessment & Plan Note (Signed)
stable overall by history and exam, recent data reviewed with pt, and pt to continue medical treatment as before,  to f/u any worsening symptoms or concerns BP Readings from Last 3 Encounters:  10/04/14 128/82  07/04/14 118/64  06/27/14 128/76

## 2014-10-04 NOTE — Progress Notes (Signed)
Pre visit review using our clinic review tool, if applicable. No additional management support is needed unless otherwise documented below in the visit note. 

## 2014-10-04 NOTE — Assessment & Plan Note (Signed)
Most c/w tenosynovitis , cant r/o wrist arthritic flare as well but does not appear to be gout or infectious, suspect all related to hammer use episode and overall some improved, ok for otc alleve, rest and should complete healing in 1-2 more wks, to see Dr Smith/sport med if not improving

## 2014-10-04 NOTE — Progress Notes (Signed)
Subjective:    Patient ID: Mark [name], male    DOB: 05-Nov-1945, 69 y.o.   MRN: [id number]  HPI    Here with acute onset 2 wks right wrist and hand pain, swelling and lessening grip strenght after several hours use of a hammer.  Has tried some indocin he has at home for gout which helped somewhat, but 25 mg dose not very good overall, only lasts a few hours.  Overall improved in that there seems to be no longer the swelling and pain to the post hand and fingers, but He is quite surprised it has lasted this long, and still has swelling and pain and less grip involving the thumb and wrist.  Nothing else seems to make better or worse. No other trauma.  No fever or skin changes Past Medical History  Diagnosis Date  . Benign prostatic hypertrophy   . Psoriasis   . Cholelithiasis     asymptomatic  . Sclerosing cholangitis   . Hemolytic anemia   . Nephrolithiasis   . Crohn disease   . Abnormal LFTs   . Hypertension    Past Surgical History  Procedure Laterality Date  . Hand surgery      cyst removal- left hand secondary to broken finger  . Stapedectomy    . Hammer toe surgery    . Lumbar laminectomy    . Dental implant    . Cyst removal with bone graft Right     reports that he quit smoking about 17 years ago. He has never used smokeless tobacco. He reports that he drinks about 38.5 oz of alcohol per week. He reports that he does not use illicit drugs. family history includes Breast cancer in an other family member; Clotting disorder in an other family member; Diabetes in his brother; Lung cancer in his father and another family member; Thyroid cancer in his mother. There is no history of Colon cancer. Allergies  Allergen Reactions  . Penicillins Hives    REACTION: hives  . Typhoid Vaccines Rash and Other (See Comments)    "fever"   Current Outpatient Prescriptions on File Prior to Visit  Medication Sig Dispense Refill  . Ascorbic Acid (VITAMIN C PO) Take 1 capsule by mouth  daily.      Marland Kitchen aspirin 81 MG EC tablet TAKE 1 TABLET (81 MG TOTAL) BY MOUTH DAILY. 90 tablet 3  . b complex vitamins tablet Take 1 tablet by mouth daily.      . Calcium Carbonate-Vitamin D (CALCIUM + D PO) Take 1 capsule by mouth 2 (two) times daily.      . Iodoquinol-HC (HYDROCORTISONE-IODOQUINOL) 1-1 % CREA   3  . irbesartan (AVAPRO) 300 MG tablet Take 1 tablet (300 mg total) by mouth daily. 90 tablet 3  . mercaptopurine (PURINETHOL) 50 MG tablet Take 1/2 tablet by mouth once daily 45 tablet 3  . mesalamine (LIALDA) 1.2 G EC tablet TAKE 2 TABLETS BY MOUTH DAILY WITH BREAKFAST 180 tablet 3  . Multiple Vitamins-Minerals (CENTRUM PO) Take 1 tablet by mouth daily.      . Omega-3 Fatty Acids (FISH OIL CONCENTRATE PO) Take 1 capsule by mouth daily.      Marland Kitchen triamcinolone cream (KENALOG) 0.1 % APPLY ON THE SKIN TWICE A DAY  2  . VITAMIN A PO Take 1 capsule by mouth daily.     . vitamin E 400 UNIT capsule Take 400 Units by mouth daily.     No current facility-administered medications on  file prior to visit.   Review of Systems  Constitutional: Negative for unusual diaphoresis or night sweats HENT: Negative for ringing in ear or discharge Eyes: Negative for double vision or worsening visual disturbance.  Respiratory: Negative for choking and stridor.   Gastrointestinal: Negative for vomiting or other signifcant bowel change Genitourinary: Negative for hematuria or change in urine volume.  Musculoskeletal: Negative for other MSK pain or swelling Skin: Negative for color change and worsening wound.  Neurological: Negative for tremors and numbness other than noted  Psychiatric/Behavioral: Negative for decreased concentration or agitation other than above       Objective:   Physical Exam BP 128/82 mmHg  Pulse 64  Temp(Src) 97.8 F (36.6 C) (Oral)  Ht 6' (1.829 m)  Wt 226 lb (102.513 kg)  BMI 30.64 kg/m2  SpO2 96% VS noted,  Constitutional: Pt appears in no significant distress HENT: Head:  NCAT.  Right Ear: External ear normal.  Left Ear: External ear normal.  Eyes: . Pupils are equal, round, and reactive to light. Conjunctivae and EOM are normal Neck: Normal range of motion. Neck supple.  Cardiovascular: Normal rate and regular rhythm.   Pulmonary/Chest: Effort normal and breath sounds without rales or wheezing.  RUE with heat/warm and nondisrete visible swelling to first dorsal compartment and medial post wrist area Neurological: Pt is alert. Not confused , motor intact 5/5 grip and all extremities Skin: Skin is warm. No rash, no LE edema Psychiatric: Pt behavior is normal. No agitation.     Assessment & Plan:

## 2014-10-04 NOTE — Assessment & Plan Note (Signed)
Lab Results  Component Value Date   LDLCALC 139* 06/27/2014   D/w pt - to cont hold statin due to liver hx,  Lower chol diet, to f/u any worsening symptoms or concerns

## 2014-10-04 NOTE — Patient Instructions (Signed)
Please take all new medication as prescribed - the OTC alleve as needed  Please continue to rest the right wrist and hand  Please continue all other medications as before, and refills have been done if requested - the indocin for gout as needed only  Please have the pharmacy call with any other refills you may need.  Please continue your efforts at being more active, low cholesterol diet, and weight control.  Please keep your appointments with your specialists as you may have planned

## 2014-10-04 NOTE — Assessment & Plan Note (Signed)
Stable at this time, gave rx for indocin prn gout attack,  to f/u any worsening symptoms or concerns

## 2014-10-05 DIAGNOSIS — L400 Psoriasis vulgaris: Secondary | ICD-10-CM | POA: Diagnosis not present

## 2014-10-09 DIAGNOSIS — L400 Psoriasis vulgaris: Secondary | ICD-10-CM | POA: Diagnosis not present

## 2014-10-13 DIAGNOSIS — L400 Psoriasis vulgaris: Secondary | ICD-10-CM | POA: Diagnosis not present

## 2014-10-17 DIAGNOSIS — L400 Psoriasis vulgaris: Secondary | ICD-10-CM | POA: Diagnosis not present

## 2014-10-19 DIAGNOSIS — L400 Psoriasis vulgaris: Secondary | ICD-10-CM | POA: Diagnosis not present

## 2014-10-23 DIAGNOSIS — L400 Psoriasis vulgaris: Secondary | ICD-10-CM | POA: Diagnosis not present

## 2014-10-27 DIAGNOSIS — L400 Psoriasis vulgaris: Secondary | ICD-10-CM | POA: Diagnosis not present

## 2014-10-30 DIAGNOSIS — L400 Psoriasis vulgaris: Secondary | ICD-10-CM | POA: Diagnosis not present

## 2014-11-02 DIAGNOSIS — L400 Psoriasis vulgaris: Secondary | ICD-10-CM | POA: Diagnosis not present

## 2014-11-03 ENCOUNTER — Encounter: Payer: Self-pay | Admitting: Internal Medicine

## 2014-11-07 DIAGNOSIS — L400 Psoriasis vulgaris: Secondary | ICD-10-CM | POA: Diagnosis not present

## 2014-11-09 DIAGNOSIS — L400 Psoriasis vulgaris: Secondary | ICD-10-CM | POA: Diagnosis not present

## 2014-11-13 DIAGNOSIS — L400 Psoriasis vulgaris: Secondary | ICD-10-CM | POA: Diagnosis not present

## 2014-11-16 DIAGNOSIS — L400 Psoriasis vulgaris: Secondary | ICD-10-CM | POA: Diagnosis not present

## 2014-11-20 DIAGNOSIS — L400 Psoriasis vulgaris: Secondary | ICD-10-CM | POA: Diagnosis not present

## 2014-11-23 DIAGNOSIS — L400 Psoriasis vulgaris: Secondary | ICD-10-CM | POA: Diagnosis not present

## 2014-11-28 DIAGNOSIS — M779 Enthesopathy, unspecified: Secondary | ICD-10-CM | POA: Diagnosis not present

## 2014-11-28 DIAGNOSIS — Z79899 Other long term (current) drug therapy: Secondary | ICD-10-CM | POA: Diagnosis not present

## 2014-11-28 DIAGNOSIS — K830 Cholangitis: Secondary | ICD-10-CM | POA: Diagnosis not present

## 2014-11-28 DIAGNOSIS — L409 Psoriasis, unspecified: Secondary | ICD-10-CM | POA: Diagnosis not present

## 2014-11-28 DIAGNOSIS — L298 Other pruritus: Secondary | ICD-10-CM | POA: Diagnosis not present

## 2014-11-29 DIAGNOSIS — L400 Psoriasis vulgaris: Secondary | ICD-10-CM | POA: Diagnosis not present

## 2014-12-04 DIAGNOSIS — L400 Psoriasis vulgaris: Secondary | ICD-10-CM | POA: Diagnosis not present

## 2014-12-07 DIAGNOSIS — L400 Psoriasis vulgaris: Secondary | ICD-10-CM | POA: Diagnosis not present

## 2014-12-11 DIAGNOSIS — L400 Psoriasis vulgaris: Secondary | ICD-10-CM | POA: Diagnosis not present

## 2014-12-14 DIAGNOSIS — L400 Psoriasis vulgaris: Secondary | ICD-10-CM | POA: Diagnosis not present

## 2014-12-18 DIAGNOSIS — L400 Psoriasis vulgaris: Secondary | ICD-10-CM | POA: Diagnosis not present

## 2014-12-21 DIAGNOSIS — L400 Psoriasis vulgaris: Secondary | ICD-10-CM | POA: Diagnosis not present

## 2014-12-26 DIAGNOSIS — L400 Psoriasis vulgaris: Secondary | ICD-10-CM | POA: Diagnosis not present

## 2014-12-28 DIAGNOSIS — L400 Psoriasis vulgaris: Secondary | ICD-10-CM | POA: Diagnosis not present

## 2015-01-02 DIAGNOSIS — L400 Psoriasis vulgaris: Secondary | ICD-10-CM | POA: Diagnosis not present

## 2015-01-04 DIAGNOSIS — L400 Psoriasis vulgaris: Secondary | ICD-10-CM | POA: Diagnosis not present

## 2015-01-09 DIAGNOSIS — L400 Psoriasis vulgaris: Secondary | ICD-10-CM | POA: Diagnosis not present

## 2015-01-18 ENCOUNTER — Ambulatory Visit
Admission: RE | Admit: 2015-01-18 | Discharge: 2015-01-18 | Disposition: A | Discharge status: Home / Self Care | Payer: Medicare Other | Source: Ambulatory Visit | Attending: Gastroenterology | Admitting: Gastroenterology

## 2015-01-18 ENCOUNTER — Ambulatory Visit (INDEPENDENT_AMBULATORY_CARE_PROVIDER_SITE_OTHER): Payer: Medicare Other | Admitting: Gastroenterology

## 2015-01-18 ENCOUNTER — Encounter: Payer: Self-pay | Admitting: Gastroenterology

## 2015-01-18 ENCOUNTER — Other Ambulatory Visit: Payer: Medicare Other

## 2015-01-18 VITALS — BP 104/66 | HR 80 | Ht 70.5 in | Wt 230.4 lb

## 2015-01-18 DIAGNOSIS — K50919 Crohn's disease, unspecified, with unspecified complications: Secondary | ICD-10-CM

## 2015-01-18 DIAGNOSIS — M810 Age-related osteoporosis without current pathological fracture: Secondary | ICD-10-CM | POA: Diagnosis not present

## 2015-01-18 DIAGNOSIS — K5010 Crohn's disease of large intestine without complications: Secondary | ICD-10-CM

## 2015-01-18 DIAGNOSIS — K8301 Primary sclerosing cholangitis: Secondary | ICD-10-CM

## 2015-01-18 DIAGNOSIS — K5090 Crohn's disease, unspecified, without complications: Secondary | ICD-10-CM | POA: Diagnosis not present

## 2015-01-18 DIAGNOSIS — Z23 Encounter for immunization: Secondary | ICD-10-CM | POA: Diagnosis not present

## 2015-01-18 DIAGNOSIS — K830 Cholangitis: Secondary | ICD-10-CM | POA: Diagnosis not present

## 2015-01-18 MED ORDER — POLYETHYLENE GLYCOL 3350 17 GM/SCOOP PO POWD: 1.0000 | Freq: Every day | ORAL | Status: DC

## 2015-01-18 MED ORDER — MESALAMINE 1.2 G PO TBEC: DELAYED_RELEASE_TABLET | ORAL | Status: DC

## 2015-01-18 NOTE — Patient Instructions (Signed)
Your physician has requested that you go to the basement for lab work before leaving today.  You have been scheduled for a DEXA scan on 01/22/2015 at 10:00am in our basement Radiology department.   We have sent medications to your pharmacy for you to pick up at your convenience.

## 2015-01-18 NOTE — Progress Notes (Signed)
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 New patient to me. Diagnosed with Crohns colitis in 1999, also with history of [place].  He thinks he was diagnosed with [place] around 10 years ago or so. He had been followed at [facility] for [facility], he was last seen this past fall at [facility]. He had an MRI of his liver in spring 2016. It was recommended that he have a colonoscopy and MRI of his liver every year or so, which he states he has declined to do.   In reviewing his history of colitis, he was told he had Crohns colitis and was initially on Asacol, and then switched to [place]. He has never been on Remicade or Humira, or other anti-TNFs. He reports a history of a colitis flare in 1999, his last hospitalization. 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 He thinks he was on a higher dose historically but not sure why he is on such a low dose. [facility] 6TG level. He denies any side effects historically to 6MP.   Patient reports doing fairly well in general since his last visit. He reports he had been having some problems with constipation recently, and stopped Lialda recently as he thought it may be making him constipated. He stopped it roughly 3 weeks or so. He reports having one BM per day or so, sometimes 2. He is having some rare blood in the stools with constipated stools, he thinks due to hemorrhoids. No abdominal pains. No weight loss. Since stopping the Lialda he has not had any benefit with his constipation. He is taking dulcolax PRN which helps. He previously has had significant alcohol use, but reports he has since cut back. No history of cirrhosis. No pruritus.  He has been awaiting his hepatitis vaccinations for A/B, due for his last today.   Last colonoscopy: 11/18/2013 - multiple large pseudopolyps, no  active inflammation, no dysplasia      Past Medical History  Diagnosis Date  . Benign prostatic hypertrophy   . Psoriasis   . Cholelithiasis     asymptomatic  . Sclerosing cholangitis   . Hemolytic anemia ([place]   . Nephrolithiasis   . Crohn disease ([place]   . Abnormal LFTs   . Hypertension      Past Surgical History  Procedure Laterality Date  . Hand surgery      cyst removal- left hand secondary to broken finger  . Stapedectomy    . Hammer toe surgery    . Lumbar laminectomy    . Dental implant    . Cyst removal with bone graft Right    Family History  Problem Relation Age of Onset  . Colon cancer Neg Hx   . Thyroid cancer Mother   . Breast cancer      aunt  . Lung cancer      aunt  . Clotting disorder      uncle  . Diabetes Brother   . Lung cancer Father    Social History  Substance Use Topics  . Smoking status: Former Smoker    Quit date: 01/06/1997  . Smokeless tobacco: Never Used  . Alcohol Use: None   Current Outpatient Prescriptions  Medication Sig Dispense Refill  . Ascorbic Acid (VITAMIN C  PO) Take 1 capsule by mouth daily.      Marland Kitchen aspirin 81 MG EC tablet TAKE 1 TABLET (81 MG TOTAL) BY MOUTH DAILY. 90 tablet 3  . b complex vitamins tablet Take 1 tablet by mouth daily.      . Calcium Carbonate-Vitamin D (CALCIUM + D PO) Take 1 capsule by mouth 2 (two) times daily.      . Iodoquinol-HC (HYDROCORTISONE-IODOQUINOL) 1-1 % CREA   3  . irbesartan (AVAPRO) 300 MG tablet Take 1 tablet (300 mg total) by mouth daily. 90 tablet 3  . mercaptopurine (PURINETHOL) 50 MG tablet Take 1/2 tablet by mouth once daily 45 tablet 3  . mesalamine (LIALDA) 1.2 g EC tablet TAKE 2 TABLETS BY MOUTH DAILY WITH BREAKFAST 180 tablet 3  . Multiple Vitamins-Minerals (CENTRUM PO) Take 1 tablet by mouth daily.      . Omega-3 Fatty Acids (FISH OIL CONCENTRATE PO) Take 1 capsule by mouth daily.      Marland Kitchen triamcinolone cream (KENALOG) 0.1 % APPLY ON THE SKIN TWICE A DAY  2  . VITAMIN A PO  Take 1 capsule by mouth daily.     . vitamin E 400 UNIT capsule Take 400 Units by mouth daily.    . polyethylene glycol powder (GLYCOLAX/MIRALAX) powder Take 255 g by mouth daily. 255 g 3   No current facility-administered medications for this visit.   Allergies  Allergen Reactions  . Penicillins Hives    REACTION: hives  . Typhoid Vaccines Rash and Other (See Comments)    "fever"     Review of Systems: All systems reviewed and negative except where noted in HPI.   Labs from [place] reviewed in  Care everywhere. Normal CA 19-9 in November.   Lab Results  Component Value Date   ALT 41 06/27/2014   AST 29 06/27/2014   ALKPHOS 185* 06/27/2014   BILITOT 0.6 06/27/2014    Lab Results  Component Value Date   WBC 7.8 06/27/2014   HGB 14.1 06/27/2014   HCT 41.7 06/27/2014   MCV 90.0 06/27/2014   PLT 329.0 06/27/2014    Lab Results  Component Value Date   CREATININE 0.81 06/27/2014   BUN 14 06/27/2014   NA 136 06/27/2014   K 4.0 06/27/2014   CL 101 06/27/2014   CO2 31 06/27/2014     Physical Exam: BP 104/66 mmHg  Pulse 80  Ht 5' 10.5" (1.791 m)  Wt 230 lb 6 oz (104.497 kg)  BMI 32.58 kg/m2 Constitutional: Pleasant,well-developed, male in no acute distress. HEENT: Normocephalic and atraumatic. Conjunctivae are normal. No scleral icterus. Neck supple.  Cardiovascular: Normal rate, regular rhythm.  Pulmonary/chest: Effort normal and breath sounds normal. No wheezing, rales or rhonchi. Abdominal: Soft, nondistended, protuberant, nontender. Bowel sounds active throughout. There are no masses palpable. No hepatomegaly. Extremities: no edema Lymphadenopathy: No cervical adenopathy noted. Neurological: Alert and oriented to person place and time. Skin: Skin is warm and dry. No rashes noted. Psychiatric: Normal mood and affect. Behavior is normal.   ASSESSMENT AND PLAN: 70 y/o male, new patient to me, with history of longstanding reported Crohn's colitis as well as PSC.  Each of these issues is addressed as outlined below.   Colitis - primarily maintained on mesalamine and 6MP over the years, he has not had escalation above this therapy. His colonoscopies have historically showed pseudopolyposis. I discussed his colitis history with him and long term management recommendations. Given his [place] he is high risk for colon cancer  and a colonoscopy is recommended every year. Further, with his history of extensive pseudopolyposis, this increased his risk for colon cancer even further. He has had a recent change in bowel habits and I strongly recommend he have a colonoscopy at this time for surveillance purposes. While acknowledging the reasons for recommending a colonoscopy, he declined, stating "everything looked good last time". We discussed the risks for colon cancer that he has and my recommendation is a colonoscopy now and then yearly for surveillance purposes, if he chooses to proceed with this he can schedule at his convenience. Otherwise, I recommend he resume taking Lialda 2.4gm per day for his colitis management, discussed risks of flares with cessation of therapy. It is not clear to me however why he is on such a low dose of 6MP, unless he is an intermediate TPMT metabolizer and I don't see this lab previously in our system, suspect it was drawn remotely. If he is a normal metabolizer, I doubt he is getting much benefit from such a low dose and may be able to just stop it. We will check his TPMT status and thiopurine levels to clarify this point, and if his levels are subtherapeutic, options would be to increase dose, or stop it given he has been on this dose and likely subtherapeutic longstanding. His preference would be to stop it. Regarding his constipation recently, will try some miralax and see if this helps. We will await the results of labs otherwise and he will be contacted recommendations.   [place] - we discussed this for a bit and he sees [facility] yearly for this issue.  His last LFTs, CA 19-9, and labs were done in November at [facility] and were reviewed. Recommend he had RUQ [place] or MRI liver every 6-12 months along with CA 19-9 levels to screen for biliary tract / liver malignancies. We discussed how he is high-risk for this, but he declines further imaging, as has been likewise been documented at [facility]. Recommend we repeat his labs in 3 months to ensure stable. He is due for a DEXA scan and should have this every few years, will refer for this.  If he changes his mind about screening, would recommend an MRI yearly or [place] at the minimum. He declined stating he did not wish to have further imaging. We discussed the reasons behind screening and his risk factors, and while he verbalized understanding, declined.   We will await his thiopurine levels and DEXA, he will follow up for labs and see me again in 3 months. I will contact him with results of studies.   [place] Cellar, MD [facility] Gastroenterology Pager [phone]

## 2015-01-22 ENCOUNTER — Ambulatory Visit (INDEPENDENT_AMBULATORY_CARE_PROVIDER_SITE_OTHER)
Admission: RE | Admit: 2015-01-22 | Discharge: 2015-01-22 | Disposition: A | Discharge status: Home / Self Care | Payer: Medicare Other | Source: Ambulatory Visit | Attending: Gastroenterology | Admitting: Gastroenterology

## 2015-01-22 DIAGNOSIS — M810 Age-related osteoporosis without current pathological fracture: Secondary | ICD-10-CM

## 2015-01-22 DIAGNOSIS — K50919 Crohn's disease, unspecified, with unspecified complications: Secondary | ICD-10-CM | POA: Diagnosis not present

## 2015-01-23 DIAGNOSIS — L400 Psoriasis vulgaris: Secondary | ICD-10-CM | POA: Diagnosis not present

## 2015-01-23 LAB — THIOPURINE METABOLITES
6 MMPN METABOLITE: 199 pmol/8x 10E8
6 TGN METABOLITE: 101 pmol/8x 10E8

## 2015-01-25 ENCOUNTER — Encounter: Payer: Self-pay | Admitting: *Deleted

## 2015-01-25 DIAGNOSIS — L400 Psoriasis vulgaris: Secondary | ICD-10-CM | POA: Diagnosis not present

## 2015-01-27 LAB — THIOPURINE METHYLTRANSFERASE (TPMT), RBC: Thiopurine Methyltransferase, RBC: 19 nmol/hr/mL RBC

## 2015-01-29 DIAGNOSIS — L400 Psoriasis vulgaris: Secondary | ICD-10-CM | POA: Diagnosis not present

## 2015-02-01 DIAGNOSIS — L400 Psoriasis vulgaris: Secondary | ICD-10-CM | POA: Diagnosis not present

## 2015-02-06 DIAGNOSIS — L400 Psoriasis vulgaris: Secondary | ICD-10-CM | POA: Diagnosis not present

## 2015-02-08 DIAGNOSIS — L400 Psoriasis vulgaris: Secondary | ICD-10-CM | POA: Diagnosis not present

## 2015-02-12 DIAGNOSIS — L400 Psoriasis vulgaris: Secondary | ICD-10-CM | POA: Diagnosis not present

## 2015-02-15 DIAGNOSIS — L400 Psoriasis vulgaris: Secondary | ICD-10-CM | POA: Diagnosis not present

## 2015-02-19 DIAGNOSIS — L400 Psoriasis vulgaris: Secondary | ICD-10-CM | POA: Diagnosis not present

## 2015-02-22 DIAGNOSIS — L400 Psoriasis vulgaris: Secondary | ICD-10-CM | POA: Diagnosis not present

## 2015-03-01 DIAGNOSIS — L400 Psoriasis vulgaris: Secondary | ICD-10-CM | POA: Diagnosis not present

## 2015-03-05 DIAGNOSIS — L400 Psoriasis vulgaris: Secondary | ICD-10-CM | POA: Diagnosis not present

## 2015-03-08 DIAGNOSIS — L400 Psoriasis vulgaris: Secondary | ICD-10-CM | POA: Diagnosis not present

## 2015-03-12 DIAGNOSIS — L400 Psoriasis vulgaris: Secondary | ICD-10-CM | POA: Diagnosis not present

## 2015-03-15 DIAGNOSIS — L400 Psoriasis vulgaris: Secondary | ICD-10-CM | POA: Diagnosis not present

## 2015-03-19 DIAGNOSIS — L400 Psoriasis vulgaris: Secondary | ICD-10-CM | POA: Diagnosis not present

## 2015-03-22 DIAGNOSIS — L400 Psoriasis vulgaris: Secondary | ICD-10-CM | POA: Diagnosis not present

## 2015-03-26 DIAGNOSIS — L400 Psoriasis vulgaris: Secondary | ICD-10-CM | POA: Diagnosis not present

## 2015-03-29 DIAGNOSIS — L400 Psoriasis vulgaris: Secondary | ICD-10-CM | POA: Diagnosis not present

## 2015-04-03 DIAGNOSIS — L400 Psoriasis vulgaris: Secondary | ICD-10-CM | POA: Diagnosis not present

## 2015-04-05 DIAGNOSIS — L400 Psoriasis vulgaris: Secondary | ICD-10-CM | POA: Diagnosis not present

## 2015-04-06 ENCOUNTER — Telehealth: Payer: Self-pay

## 2015-04-06 NOTE — Telephone Encounter (Signed)
Pt called stated he came to see Dr. Jenny Reichmann in 09/2015 and got his ear clean and a flu shot from our office. Please check

## 2015-04-06 NOTE — Telephone Encounter (Signed)
LVM for pt to call back as soon as possible.   RE: Flu Vaccine for this flu season.

## 2015-04-09 DIAGNOSIS — L400 Psoriasis vulgaris: Secondary | ICD-10-CM | POA: Diagnosis not present

## 2015-04-09 NOTE — Telephone Encounter (Signed)
Patient came in for hand swelling in sept 2016, did not get ears cleaned per dr Jenny Reichmann office notes, no mention of flu vaccine in dr Gwynn Burly office visit

## 2015-04-12 DIAGNOSIS — L400 Psoriasis vulgaris: Secondary | ICD-10-CM | POA: Diagnosis not present

## 2015-04-13 DIAGNOSIS — C44621 Squamous cell carcinoma of skin of unspecified upper limb, including shoulder: Secondary | ICD-10-CM | POA: Diagnosis not present

## 2015-04-13 DIAGNOSIS — C44622 Squamous cell carcinoma of skin of right upper limb, including shoulder: Secondary | ICD-10-CM | POA: Diagnosis not present

## 2015-04-13 DIAGNOSIS — D485 Neoplasm of uncertain behavior of skin: Secondary | ICD-10-CM | POA: Diagnosis not present

## 2015-04-13 DIAGNOSIS — D692 Other nonthrombocytopenic purpura: Secondary | ICD-10-CM | POA: Diagnosis not present

## 2015-04-16 DIAGNOSIS — L400 Psoriasis vulgaris: Secondary | ICD-10-CM | POA: Diagnosis not present

## 2015-04-19 DIAGNOSIS — L400 Psoriasis vulgaris: Secondary | ICD-10-CM | POA: Diagnosis not present

## 2015-04-23 DIAGNOSIS — L400 Psoriasis vulgaris: Secondary | ICD-10-CM | POA: Diagnosis not present

## 2015-05-02 ENCOUNTER — Other Ambulatory Visit (INDEPENDENT_AMBULATORY_CARE_PROVIDER_SITE_OTHER): Payer: Medicare Other

## 2015-05-02 ENCOUNTER — Encounter: Payer: Self-pay | Admitting: Internal Medicine

## 2015-05-02 ENCOUNTER — Ambulatory Visit (INDEPENDENT_AMBULATORY_CARE_PROVIDER_SITE_OTHER): Payer: Medicare Other | Admitting: Internal Medicine

## 2015-05-02 VITALS — BP 138/78 | HR 78 | Temp 97.8°F | Resp 20 | Wt 235.0 lb

## 2015-05-02 DIAGNOSIS — H9193 Unspecified hearing loss, bilateral: Secondary | ICD-10-CM | POA: Insufficient documentation

## 2015-05-02 DIAGNOSIS — Z125 Encounter for screening for malignant neoplasm of prostate: Secondary | ICD-10-CM | POA: Diagnosis not present

## 2015-05-02 DIAGNOSIS — N320 Bladder-neck obstruction: Secondary | ICD-10-CM

## 2015-05-02 DIAGNOSIS — E785 Hyperlipidemia, unspecified: Secondary | ICD-10-CM

## 2015-05-02 DIAGNOSIS — I10 Essential (primary) hypertension: Secondary | ICD-10-CM | POA: Diagnosis not present

## 2015-05-02 LAB — CBC WITH DIFFERENTIAL/PLATELET
BASOS ABS: 0 10*3/uL (ref 0.0–0.1)
BASOS PCT: 0.6 % (ref 0.0–3.0)
EOS ABS: 0.4 10*3/uL (ref 0.0–0.7)
Eosinophils Relative: 4.7 % (ref 0.0–5.0)
HEMATOCRIT: 39.5 % (ref 39.0–52.0)
HEMOGLOBIN: 13.5 g/dL (ref 13.0–17.0)
LYMPHS PCT: 16.3 % (ref 12.0–46.0)
Lymphs Abs: 1.2 10*3/uL (ref 0.7–4.0)
MCHC: 34.1 g/dL (ref 30.0–36.0)
MCV: 84.1 fl (ref 78.0–100.0)
MONO ABS: 0.7 10*3/uL (ref 0.1–1.0)
Monocytes Relative: 8.8 % (ref 3.0–12.0)
Neutro Abs: 5.3 10*3/uL (ref 1.4–7.7)
Neutrophils Relative %: 69.6 % (ref 43.0–77.0)
Platelets: 347 10*3/uL (ref 150.0–400.0)
RBC: 4.7 Mil/uL (ref 4.22–5.81)
RDW: 14.1 % (ref 11.5–15.5)
WBC: 7.6 10*3/uL (ref 4.0–10.5)

## 2015-05-02 LAB — HEPATIC FUNCTION PANEL
ALBUMIN: 3.8 g/dL (ref 3.5–5.2)
ALT: 52 U/L (ref 0–53)
AST: 31 U/L (ref 0–37)
Alkaline Phosphatase: 214 U/L — ABNORMAL HIGH (ref 39–117)
Bilirubin, Direct: 0.1 mg/dL (ref 0.0–0.3)
TOTAL PROTEIN: 7.4 g/dL (ref 6.0–8.3)
Total Bilirubin: 0.6 mg/dL (ref 0.2–1.2)

## 2015-05-02 LAB — URINALYSIS, ROUTINE W REFLEX MICROSCOPIC
BILIRUBIN URINE: NEGATIVE
HGB URINE DIPSTICK: NEGATIVE
Ketones, ur: NEGATIVE
Leukocytes, UA: NEGATIVE
NITRITE: NEGATIVE
Specific Gravity, Urine: 1.01 (ref 1.000–1.030)
TOTAL PROTEIN, URINE-UPE24: NEGATIVE
URINE GLUCOSE: NEGATIVE
UROBILINOGEN UA: 0.2 (ref 0.0–1.0)
pH: 6.5 (ref 5.0–8.0)

## 2015-05-02 LAB — PSA: PSA: 2.01 ng/mL (ref 0.10–4.00)

## 2015-05-02 LAB — BASIC METABOLIC PANEL
BUN: 15 mg/dL (ref 6–23)
CHLORIDE: 103 meq/L (ref 96–112)
CO2: 29 meq/L (ref 19–32)
CREATININE: 0.85 mg/dL (ref 0.40–1.50)
Calcium: 9.5 mg/dL (ref 8.4–10.5)
GFR: 95.29 mL/min (ref >60.00)
GLUCOSE: 153 mg/dL — AB (ref 70–99)
POTASSIUM: 4.2 meq/L (ref 3.5–5.1)
Sodium: 139 mEq/L (ref 135–145)

## 2015-05-02 LAB — TSH: TSH: 0.63 u[IU]/mL (ref 0.35–4.50)

## 2015-05-02 LAB — LIPID PANEL
CHOL/HDL RATIO: 4
CHOLESTEROL: 243 mg/dL — AB (ref 0–200)
HDL: 62.5 mg/dL (ref >39.00)
LDL Cholesterol: 152 mg/dL — ABNORMAL HIGH (ref 0–99)
NonHDL: 180.59
TRIGLYCERIDES: 143 mg/dL (ref 0.0–149.0)
VLDL: 28.6 mg/dL (ref 0.0–40.0)

## 2015-05-02 NOTE — Patient Instructions (Addendum)
Your ears were irrigated of wax today  Please continue all other medications as before, and refills have been done if requested.  Please have the pharmacy call with any other refills you may need.  Please continue your efforts at being more active, low cholesterol diet, and weight control.  You are otherwise up to date with prevention measures today.  Please keep your appointments with your specialists as you may have planned  Please go to the LAB in the Basement (turn left off the elevator) for the tests to be done today  You will be contacted by phone if any changes need to be made immediately.  Otherwise, you will receive a letter about your results with an explanation, but please check with MyChart first.  Please remember to sign up for MyChart if you have not done so, as this will be important to you in the future with finding out test results, communicating by private email, and scheduling acute appointments online when needed.

## 2015-05-02 NOTE — Progress Notes (Addendum)
Subjective:    Patient ID: Mark [name], male    DOB: [birth year], 70 y.o.   MRN: [id number]  HPI Here to f/u; overall doing ok,  Pt denies chest pain, increasing sob or doe, wheezing, orthopnea, PND, increased LE swelling, palpitations, dizziness or syncope.  Pt denies new neurological symptoms such as new headache, or facial or extremity weakness or numbness.  Pt denies polydipsia, polyuria, or low sugar episode.   Pt denies new neurological symptoms such as new headache, or facial or extremity weakness or numbness.   Pt states overall good compliance with meds, mostly trying to follow appropriate diet, with wt overall stable,  but little exercise however. Has had 1 wk onset bilat hearing loss - ? Wax impactions.  No ear pain, d/c, vertigo  Denies urinary symptoms such as dysuria, frequency, urgency, flank pain, hematuria or n/v, fever, chills. Wt Readings from Last 3 Encounters:  05/02/15 235 lb (106.595 kg)  01/18/15 230 lb 6 oz (104.497 kg)  10/04/14 226 lb (102.513 kg)   Past Medical History  Diagnosis Date  . Benign prostatic hypertrophy   . Psoriasis   . Cholelithiasis     asymptomatic  . Sclerosing cholangitis   . Hemolytic anemia ([place])   . Nephrolithiasis   . Crohn disease ([place])   . Abnormal LFTs   . Hypertension   . Hyperglycemia 05/03/2015   Past Surgical History  Procedure Laterality Date  . Hand surgery      cyst removal- left hand secondary to broken finger  . Stapedectomy    . Hammer toe surgery    . Lumbar laminectomy    . Dental implant    . Cyst removal with bone graft Right     reports that he quit smoking about 18 years ago. He has never used smokeless tobacco. He reports that he does not use illicit drugs. His alcohol history is not on file. family history includes Diabetes in his brother; Lung cancer in his father; Thyroid cancer in his mother. There is no history of Colon cancer. Allergies  Allergen Reactions  . Penicillins Hives    REACTION: hives    . Typhoid Vaccines Rash and Other (See Comments)    "fever"   Current Outpatient Prescriptions on File Prior to Visit  Medication Sig Dispense Refill  . Ascorbic Acid (VITAMIN C PO) Take 1 capsule by mouth daily.      Marland Kitchen aspirin 81 MG EC tablet TAKE 1 TABLET (81 MG TOTAL) BY MOUTH DAILY. 90 tablet 3  . b complex vitamins tablet Take 1 tablet by mouth daily.      . Calcium Carbonate-Vitamin D (CALCIUM + D PO) Take 1 capsule by mouth 2 (two) times daily.      . Iodoquinol-HC (HYDROCORTISONE-IODOQUINOL) 1-1 % CREA   3  . irbesartan (AVAPRO) 300 MG tablet Take 1 tablet (300 mg total) by mouth daily. 90 tablet 3  . mercaptopurine (PURINETHOL) 50 MG tablet Take 1/2 tablet by mouth once daily 45 tablet 3  . mesalamine (LIALDA) 1.2 g EC tablet TAKE 2 TABLETS BY MOUTH DAILY WITH BREAKFAST 180 tablet 3  . Multiple Vitamins-Minerals (CENTRUM PO) Take 1 tablet by mouth daily.      . Omega-3 Fatty Acids (FISH OIL CONCENTRATE PO) Take 1 capsule by mouth daily.      . polyethylene glycol powder (GLYCOLAX/MIRALAX) powder Take 255 g by mouth daily. 255 g 3  . triamcinolone cream (KENALOG) 0.1 % APPLY ON THE SKIN TWICE A  DAY  2  . VITAMIN A PO Take 1 capsule by mouth daily.     . vitamin E 400 UNIT capsule Take 400 Units by mouth daily.     No current facility-administered medications on file prior to visit.   Review of Systems  Constitutional: Negative for unusual diaphoresis or night sweats HENT: Negative for ear swelling or discharge Eyes: Negative for worsening visual haziness  Respiratory: Negative for choking and stridor.   Gastrointestinal: Negative for distension or worsening eructation Genitourinary: Negative for retention or change in urine volume.  Musculoskeletal: Negative for other MSK pain or swelling Skin: Negative for color change and worsening wound Neurological: Negative for tremors and numbness other than noted  Psychiatric/Behavioral: Negative for decreased concentration or  agitation other than above       Objective:   Physical Exam BP 138/78 mmHg  Pulse 78  Temp(Src) 97.8 F (36.6 C) (Oral)  Resp 20  Wt 235 lb (106.595 kg)  SpO2 94% VS noted, not ill appearing Constitutional: Pt appears in no apparent distress HENT: Head: NCAT.  Right Ear: External ear normal.  Left Ear: External ear normal. Bilat TM's clear after bilat wax impactions irrigated  Eyes: . Pupils are equal, round, and reactive to light. Conjunctivae and EOM are normal Neck: Normal range of motion. Neck supple.  Cardiovascular: Normal rate and regular rhythm.   Pulmonary/Chest: Effort normal and breath sounds without rales or wheezing.  Neurological: Pt is alert. Not confused , motor grossly intact, hearing improved Skin: Skin is warm. No rash, no LE edema Psychiatric: Pt behavior is normal. No agitation.     Assessment & Plan:

## 2015-05-02 NOTE — Progress Notes (Signed)
Pre visit review using our clinic review tool, if applicable. No additional management support is needed unless otherwise documented below in the visit note. 

## 2015-05-03 ENCOUNTER — Encounter: Payer: Self-pay | Admitting: Internal Medicine

## 2015-05-03 ENCOUNTER — Other Ambulatory Visit (INDEPENDENT_AMBULATORY_CARE_PROVIDER_SITE_OTHER): Payer: Medicare Other

## 2015-05-03 DIAGNOSIS — R7309 Other abnormal glucose: Secondary | ICD-10-CM | POA: Diagnosis not present

## 2015-05-03 DIAGNOSIS — L400 Psoriasis vulgaris: Secondary | ICD-10-CM | POA: Diagnosis not present

## 2015-05-03 DIAGNOSIS — R739 Hyperglycemia, unspecified: Secondary | ICD-10-CM

## 2015-05-03 HISTORY — DX: Hyperglycemia, unspecified: R73.9

## 2015-05-03 LAB — HEMOGLOBIN A1C: Hgb A1c MFr Bld: 5.2 % (ref 4.6–6.5)

## 2015-05-08 DIAGNOSIS — L400 Psoriasis vulgaris: Secondary | ICD-10-CM | POA: Diagnosis not present

## 2015-05-08 NOTE — Assessment & Plan Note (Signed)
Improved s/p bilat wax impactions resolved,  to f/u any worsening symptoms or concerns

## 2015-05-08 NOTE — Assessment & Plan Note (Signed)
stable overall by history and exam, recent data reviewed with pt, and pt to continue medical treatment as before,  to f/u any worsening symptoms or concerns, consider statin for LDL > 100

## 2015-05-08 NOTE — Assessment & Plan Note (Signed)
Asympt, for psa as is due,  to f/u any worsening symptoms or concerns

## 2015-05-08 NOTE — Assessment & Plan Note (Signed)
stable overall by history and exam, recent data reviewed with pt, and pt to continue medical treatment as before,  to f/u any worsening symptoms or concerns BP Readings from Last 3 Encounters:  05/02/15 138/78  01/18/15 104/66  10/04/14 128/82

## 2015-05-10 DIAGNOSIS — L400 Psoriasis vulgaris: Secondary | ICD-10-CM | POA: Diagnosis not present

## 2015-05-14 DIAGNOSIS — L400 Psoriasis vulgaris: Secondary | ICD-10-CM | POA: Diagnosis not present

## 2015-05-17 DIAGNOSIS — L400 Psoriasis vulgaris: Secondary | ICD-10-CM | POA: Diagnosis not present

## 2015-05-21 DIAGNOSIS — L400 Psoriasis vulgaris: Secondary | ICD-10-CM | POA: Diagnosis not present

## 2015-05-24 DIAGNOSIS — L400 Psoriasis vulgaris: Secondary | ICD-10-CM | POA: Diagnosis not present

## 2015-05-28 DIAGNOSIS — L400 Psoriasis vulgaris: Secondary | ICD-10-CM | POA: Diagnosis not present

## 2015-05-31 DIAGNOSIS — L400 Psoriasis vulgaris: Secondary | ICD-10-CM | POA: Diagnosis not present

## 2015-06-05 DIAGNOSIS — L400 Psoriasis vulgaris: Secondary | ICD-10-CM | POA: Diagnosis not present

## 2015-06-07 DIAGNOSIS — L400 Psoriasis vulgaris: Secondary | ICD-10-CM | POA: Diagnosis not present

## 2015-06-11 DIAGNOSIS — L400 Psoriasis vulgaris: Secondary | ICD-10-CM | POA: Diagnosis not present

## 2015-06-14 DIAGNOSIS — L400 Psoriasis vulgaris: Secondary | ICD-10-CM | POA: Diagnosis not present

## 2015-06-18 DIAGNOSIS — L400 Psoriasis vulgaris: Secondary | ICD-10-CM | POA: Diagnosis not present

## 2015-06-21 DIAGNOSIS — L400 Psoriasis vulgaris: Secondary | ICD-10-CM | POA: Diagnosis not present

## 2015-06-25 DIAGNOSIS — L400 Psoriasis vulgaris: Secondary | ICD-10-CM | POA: Diagnosis not present

## 2015-06-28 DIAGNOSIS — L400 Psoriasis vulgaris: Secondary | ICD-10-CM | POA: Diagnosis not present

## 2015-07-03 DIAGNOSIS — L400 Psoriasis vulgaris: Secondary | ICD-10-CM | POA: Diagnosis not present

## 2015-07-05 DIAGNOSIS — L400 Psoriasis vulgaris: Secondary | ICD-10-CM | POA: Diagnosis not present

## 2015-07-09 DIAGNOSIS — L400 Psoriasis vulgaris: Secondary | ICD-10-CM | POA: Diagnosis not present

## 2015-07-12 DIAGNOSIS — L400 Psoriasis vulgaris: Secondary | ICD-10-CM | POA: Diagnosis not present

## 2015-07-16 DIAGNOSIS — L400 Psoriasis vulgaris: Secondary | ICD-10-CM | POA: Diagnosis not present

## 2015-07-19 DIAGNOSIS — L400 Psoriasis vulgaris: Secondary | ICD-10-CM | POA: Diagnosis not present

## 2015-07-23 DIAGNOSIS — L400 Psoriasis vulgaris: Secondary | ICD-10-CM | POA: Diagnosis not present

## 2015-07-26 DIAGNOSIS — L400 Psoriasis vulgaris: Secondary | ICD-10-CM | POA: Diagnosis not present

## 2015-07-30 DIAGNOSIS — L400 Psoriasis vulgaris: Secondary | ICD-10-CM | POA: Diagnosis not present

## 2015-08-06 DIAGNOSIS — L400 Psoriasis vulgaris: Secondary | ICD-10-CM | POA: Diagnosis not present

## 2015-08-09 DIAGNOSIS — L400 Psoriasis vulgaris: Secondary | ICD-10-CM | POA: Diagnosis not present

## 2015-08-20 DIAGNOSIS — L400 Psoriasis vulgaris: Secondary | ICD-10-CM | POA: Diagnosis not present

## 2015-08-21 ENCOUNTER — Other Ambulatory Visit: Payer: Self-pay | Admitting: Internal Medicine

## 2015-08-23 DIAGNOSIS — L400 Psoriasis vulgaris: Secondary | ICD-10-CM | POA: Diagnosis not present

## 2015-08-27 DIAGNOSIS — L400 Psoriasis vulgaris: Secondary | ICD-10-CM | POA: Diagnosis not present

## 2015-08-30 DIAGNOSIS — L400 Psoriasis vulgaris: Secondary | ICD-10-CM | POA: Diagnosis not present

## 2015-09-03 DIAGNOSIS — L400 Psoriasis vulgaris: Secondary | ICD-10-CM | POA: Diagnosis not present

## 2015-09-06 DIAGNOSIS — L400 Psoriasis vulgaris: Secondary | ICD-10-CM | POA: Diagnosis not present

## 2015-09-11 DIAGNOSIS — L400 Psoriasis vulgaris: Secondary | ICD-10-CM | POA: Diagnosis not present

## 2015-09-13 DIAGNOSIS — L400 Psoriasis vulgaris: Secondary | ICD-10-CM | POA: Diagnosis not present

## 2015-09-17 DIAGNOSIS — L400 Psoriasis vulgaris: Secondary | ICD-10-CM | POA: Diagnosis not present

## 2015-09-20 DIAGNOSIS — L400 Psoriasis vulgaris: Secondary | ICD-10-CM | POA: Diagnosis not present

## 2015-09-24 DIAGNOSIS — L400 Psoriasis vulgaris: Secondary | ICD-10-CM | POA: Diagnosis not present

## 2015-09-25 DIAGNOSIS — Z7982 Long term (current) use of aspirin: Secondary | ICD-10-CM | POA: Diagnosis not present

## 2015-09-25 DIAGNOSIS — K830 Cholangitis: Secondary | ICD-10-CM | POA: Diagnosis not present

## 2015-09-25 DIAGNOSIS — K5180 Other ulcerative colitis without complications: Secondary | ICD-10-CM | POA: Diagnosis not present

## 2015-09-27 DIAGNOSIS — L400 Psoriasis vulgaris: Secondary | ICD-10-CM | POA: Diagnosis not present

## 2015-10-02 DIAGNOSIS — L400 Psoriasis vulgaris: Secondary | ICD-10-CM | POA: Diagnosis not present

## 2015-10-04 DIAGNOSIS — L400 Psoriasis vulgaris: Secondary | ICD-10-CM | POA: Diagnosis not present

## 2015-10-08 DIAGNOSIS — L400 Psoriasis vulgaris: Secondary | ICD-10-CM | POA: Diagnosis not present

## 2015-10-10 ENCOUNTER — Other Ambulatory Visit: Payer: Self-pay | Admitting: Internal Medicine

## 2015-10-11 ENCOUNTER — Encounter: Payer: Self-pay | Admitting: Internal Medicine

## 2015-10-11 ENCOUNTER — Ambulatory Visit (INDEPENDENT_AMBULATORY_CARE_PROVIDER_SITE_OTHER): Payer: Medicare Other | Admitting: Internal Medicine

## 2015-10-11 VITALS — BP 130/74 | HR 76 | Temp 98.4°F | Resp 20 | Wt 232.0 lb

## 2015-10-11 DIAGNOSIS — R739 Hyperglycemia, unspecified: Secondary | ICD-10-CM

## 2015-10-11 DIAGNOSIS — R059 Cough, unspecified: Secondary | ICD-10-CM | POA: Insufficient documentation

## 2015-10-11 DIAGNOSIS — I10 Essential (primary) hypertension: Secondary | ICD-10-CM

## 2015-10-11 DIAGNOSIS — R05 Cough: Secondary | ICD-10-CM | POA: Diagnosis not present

## 2015-10-11 DIAGNOSIS — Z23 Encounter for immunization: Secondary | ICD-10-CM | POA: Diagnosis not present

## 2015-10-11 MED ORDER — AZITHROMYCIN 250 MG PO TABS: ORAL_TABLET | ORAL | 1 refills | Status: DC

## 2015-10-11 NOTE — Assessment & Plan Note (Signed)
stable overall by history and exam, recent data reviewed with pt, and pt to continue medical treatment as before,  to f/u any worsening symptoms or concerns BP Readings from Last 3 Encounters:  10/11/15 130/74  05/02/15 138/78  01/18/15 104/66

## 2015-10-11 NOTE — Progress Notes (Signed)
Pre visit review using our clinic review tool, if applicable. No additional management support is needed unless otherwise documented below in the visit note. 

## 2015-10-11 NOTE — Assessment & Plan Note (Signed)
Mild to mod, c/w bronchitis vs pna, declines cxr,  for antibx course,  Delsym otc prn, to f/u any worsening symptoms or concerns

## 2015-10-11 NOTE — Patient Instructions (Signed)
Please take all new medication as prescribed - the antibiotic  You can also take Delsym OTC for cough, and/or Mucinex (or it's generic off brand) for congestion, and tylenol as needed for pain.  Please continue all other medications as before, and refills have been done if requested.  Please have the pharmacy call with any other refills you may need.  Please keep your appointments with your specialists as you may have planned

## 2015-10-11 NOTE — Progress Notes (Signed)
Subjective:    Patient ID: Mark [name], male    DOB: 1945/12/12, 70 y.o.   MRN: [id number]  HPIn Here with acute onset mild to mod 2-3 days ST, HA, general weakness and malaise, with prod cough greenish sputum, but Pt denies chest pain, increased sob or doe, wheezing, orthopnea, PND, increased LE swelling, palpitations, dizziness or syncope. Pt denies new neurological symptoms such as new headache, or facial or extremity weakness or numbness   Pt denies polydipsia, polyuria.   Past Medical History:  Diagnosis Date  . Abnormal LFTs   . Benign prostatic hypertrophy   . Cholelithiasis    asymptomatic  . Crohn disease ([place])   . Hemolytic anemia ([place])   . Hyperglycemia 05/03/2015  . Hypertension   . Nephrolithiasis   . Psoriasis   . Sclerosing cholangitis    Past Surgical History:  Procedure Laterality Date  . CYST REMOVAL WITH BONE GRAFT Right   . Dental Implant    . HAMMER TOE SURGERY    . HAND SURGERY     cyst removal- left hand secondary to broken finger  . LUMBAR LAMINECTOMY    . STAPEDECTOMY      reports that he quit smoking about 18 years ago. He has never used smokeless tobacco. He reports that he does not use drugs. His alcohol history is not on file. family history includes Diabetes in his brother; Lung cancer in his father; Thyroid cancer in his mother. Allergies  Allergen Reactions  . Penicillins Hives    REACTION: hives  . Typhoid Vaccines Rash and Other (See Comments)    "fever"   Current Outpatient Prescriptions on File Prior to Visit  Medication Sig Dispense Refill  . Ascorbic Acid (VITAMIN C PO) Take 1 capsule by mouth daily.      Marland Kitchen aspirin 81 MG EC tablet TAKE 1 TABLET (81 MG TOTAL) BY MOUTH DAILY. 90 tablet 0  . b complex vitamins tablet Take 1 tablet by mouth daily.      . Calcium Carbonate-Vitamin D (CALCIUM + D PO) Take 1 capsule by mouth 2 (two) times daily.      . Iodoquinol-HC (HYDROCORTISONE-IODOQUINOL) 1-1 % CREA   3  . irbesartan (AVAPRO) 300  MG tablet TAKE 1 TABLET BY MOUTH EVERY DAY 90 tablet 2  . mercaptopurine (PURINETHOL) 50 MG tablet Take 1/2 tablet by mouth once daily 45 tablet 3  . mesalamine (LIALDA) 1.2 g EC tablet TAKE 2 TABLETS BY MOUTH DAILY WITH BREAKFAST 180 tablet 3  . Multiple Vitamins-Minerals (CENTRUM PO) Take 1 tablet by mouth daily.      . Omega-3 Fatty Acids (FISH OIL CONCENTRATE PO) Take 1 capsule by mouth daily.      . polyethylene glycol powder (GLYCOLAX/MIRALAX) powder Take 255 g by mouth daily. 255 g 3  . triamcinolone cream (KENALOG) 0.1 % APPLY ON THE SKIN TWICE A DAY  2  . VITAMIN A PO Take 1 capsule by mouth daily.     . vitamin E 400 UNIT capsule Take 400 Units by mouth daily.     No current facility-administered medications on file prior to visit.    Review of Systems  Constitutional: Negative for unusual diaphoresis or night sweats HENT: Negative for ear swelling or discharge Eyes: Negative for worsening visual haziness  Respiratory: Negative for choking and stridor.   Gastrointestinal: Negative for distension or worsening eructation Genitourinary: Negative for retention or change in urine volume.  Musculoskeletal: Negative for other MSK pain or swelling  Skin: Negative for color change and worsening wound Neurological: Negative for tremors and numbness other than noted  Psychiatric/Behavioral: Negative for decreased concentration or agitation other than above       Objective:   Physical Exam BP 130/74   Pulse 76   Temp 98.4 F (36.9 C) (Oral)   Resp 20   Wt 232 lb (105.2 kg)   SpO2 94%   BMI 32.82 kg/m  VS noted, mild ill Constitutional: Pt appears in no apparent distress HENT: Head: NCAT.  Right Ear: External ear normal.  Left Ear: External ear normal.  Bilat tm's with mild erythema.  Max sinus areas non tender.  Pharynx with mild erythema, no exudate Eyes: . Pupils are equal, round, and reactive to light. Conjunctivae and EOM are normal Neck: Normal range of motion. Neck  supple.  Cardiovascular: Normal rate and regular rhythm.   Pulmonary/Chest: Effort normal and breath sounds decreased bilat without rales or wheezing.  Neurological: Pt is alert. Not confused , motor grossly intact Skin: Skin is warm. No rash, no LE edema Psychiatric: Pt behavior is normal. No agitation.     Assessment & Plan:

## 2015-10-11 NOTE — Assessment & Plan Note (Signed)
stable overall by history and exam, recent data reviewed with pt, and pt to continue medical treatment as before,  to f/u any worsening symptoms or concerns Lab Results  Component Value Date   HGBA1C 5.2 05/03/2015

## 2015-10-12 DIAGNOSIS — L400 Psoriasis vulgaris: Secondary | ICD-10-CM | POA: Diagnosis not present

## 2015-10-15 DIAGNOSIS — L400 Psoriasis vulgaris: Secondary | ICD-10-CM | POA: Diagnosis not present

## 2015-10-18 DIAGNOSIS — L400 Psoriasis vulgaris: Secondary | ICD-10-CM | POA: Diagnosis not present

## 2015-10-22 DIAGNOSIS — L400 Psoriasis vulgaris: Secondary | ICD-10-CM | POA: Diagnosis not present

## 2015-10-25 DIAGNOSIS — L400 Psoriasis vulgaris: Secondary | ICD-10-CM | POA: Diagnosis not present

## 2015-10-29 DIAGNOSIS — L400 Psoriasis vulgaris: Secondary | ICD-10-CM | POA: Diagnosis not present

## 2015-11-01 DIAGNOSIS — L400 Psoriasis vulgaris: Secondary | ICD-10-CM | POA: Diagnosis not present

## 2015-11-05 DIAGNOSIS — L400 Psoriasis vulgaris: Secondary | ICD-10-CM | POA: Diagnosis not present

## 2015-11-12 DIAGNOSIS — L400 Psoriasis vulgaris: Secondary | ICD-10-CM | POA: Diagnosis not present

## 2015-11-15 DIAGNOSIS — L400 Psoriasis vulgaris: Secondary | ICD-10-CM | POA: Diagnosis not present

## 2015-11-19 DIAGNOSIS — L400 Psoriasis vulgaris: Secondary | ICD-10-CM | POA: Diagnosis not present

## 2015-11-22 ENCOUNTER — Encounter: Payer: Self-pay | Admitting: Gastroenterology

## 2015-11-22 ENCOUNTER — Ambulatory Visit (INDEPENDENT_AMBULATORY_CARE_PROVIDER_SITE_OTHER): Payer: Medicare Other | Admitting: Gastroenterology

## 2015-11-22 VITALS — BP 114/64 | HR 88 | Ht 70.5 in | Wt 234.5 lb

## 2015-11-22 DIAGNOSIS — L400 Psoriasis vulgaris: Secondary | ICD-10-CM | POA: Diagnosis not present

## 2015-11-22 DIAGNOSIS — K5010 Crohn's disease of large intestine without complications: Secondary | ICD-10-CM

## 2015-11-22 DIAGNOSIS — K8301 Primary sclerosing cholangitis: Secondary | ICD-10-CM

## 2015-11-22 DIAGNOSIS — K830 Cholangitis: Secondary | ICD-10-CM | POA: Diagnosis not present

## 2015-11-22 MED ORDER — MESALAMINE 1.2 G PO TBEC: DELAYED_RELEASE_TABLET | ORAL | 3 refills | Status: DC

## 2015-11-22 NOTE — Patient Instructions (Signed)
Please follow up with Dr Havery Moros in 6 months.  If you are age 68 or older, your body mass index should be between 23-30. Your Body mass index is 33.17 kg/m. If this is out of the aforementioned range listed, please consider follow up with your Primary Care Provider.  If you are age 47 or younger, your body mass index should be between 19-25. Your Body mass index is 33.17 kg/m. If this is out of the aformentioned range listed, please consider follow up with your Primary Care Provider.

## 2015-11-22 NOTE — Progress Notes (Signed)
HPI :  70 y/o male with a history of colitis and PSC here for follow up. Diagnosed with Crohns colitis in 1999 per Dr. Olevia Perches (thought Crohns due to appearance and ? Rectal sparing in the past?), also with history of PSC.  He thinks he was diagnosed with [place] around 10 years ago or so. He had been followed at [facility] for [facility], he was last seen a few months ago at [facility]. He had an MRI of his liver in spring 2016. It was recommended that he have a colonoscopy and MRI of his liver every year or so, which he states he has declined to do.   Regarding his colitis history, was initially on Asacol, and then switched to Lialda. He has never been on Remicade or Humira, or other anti-TNFs. He reports a history of a colitis flare in 1999, his last hospitalization. 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 He had been on 6MP 57m per day longstanding - since the last visit his TPMT was level and 6TG level was 100, so we stopped it.   He reports taking Lialda 2 tabs daily. He reports having 1 BM per day, sometimes twice. No changes in weight. No abdominal pains. He thinks he occasionally has a pain in his pelvis, he is not sure if this is due to known renal stones. [facility] He was told to stop drinking alcohol completely. He has declined imaging of the liver. Last MRI Spring 2016.   Last colonoscopy: 11/18/2013 - multiple large pseudopolyps, no active inflammation, no dysplasia   Flu shot UTD    Past Medical History:  Diagnosis Date  . Abnormal LFTs   . Benign prostatic hypertrophy   . Cholelithiasis    asymptomatic  . Crohn disease ([place]   . Hemolytic anemia ([place]   . Hyperglycemia 05/03/2015  . Hypertension   . Nephrolithiasis   . Psoriasis   . Sclerosing cholangitis      Past Surgical History:  Procedure Laterality Date  . CYST REMOVAL WITH BONE GRAFT Right    . Dental Implant    . HAMMER TOE SURGERY    . HAND SURGERY     cyst removal- left hand secondary to broken finger  . LUMBAR LAMINECTOMY    . STAPEDECTOMY     Family History  Problem Relation Age of Onset  . Thyroid cancer Mother   . Lung cancer Father   . Breast cancer      aunt  . Lung cancer      aunt  . Clotting disorder      uncle  . Diabetes Brother   . Colon cancer Neg Hx    Social History  Substance Use Topics  . Smoking status: Former Smoker    Quit date: 01/06/1997  . Smokeless tobacco: Never Used  . Alcohol use Not on file   Current Outpatient Prescriptions  Medication Sig Dispense Refill  . Ascorbic Acid (VITAMIN C PO) Take 1 capsule by mouth daily.      .Marland Kitchenaspirin 81 MG EC tablet TAKE 1 TABLET (81 MG TOTAL) BY MOUTH DAILY. 90 tablet 0  . b complex vitamins tablet Take 1 tablet by mouth daily.      . Calcium Carbonate-Vitamin D (CALCIUM + D PO) Take 1 capsule by mouth 2 (two) times daily.      .Marland Kitchen  Iodoquinol-HC (HYDROCORTISONE-IODOQUINOL) 1-1 % CREA   3  . irbesartan (AVAPRO) 300 MG tablet TAKE 1 TABLET BY MOUTH EVERY DAY 90 tablet 2  . mesalamine (LIALDA) 1.2 g EC tablet TAKE 2 TABLETS BY MOUTH DAILY WITH BREAKFAST 180 tablet 3  . Multiple Vitamins-Minerals (CENTRUM PO) Take 1 tablet by mouth daily.      . Omega-3 Fatty Acids (FISH OIL CONCENTRATE PO) Take 1 capsule by mouth daily.      Marland Kitchen triamcinolone cream (KENALOG) 0.1 % APPLY ON THE SKIN TWICE A DAY  2  . VITAMIN A PO Take 1 capsule by mouth daily.     . vitamin E 400 UNIT capsule Take 400 Units by mouth daily.     No current facility-administered medications for this visit.    Allergies  Allergen Reactions  . Penicillins Hives    REACTION: hives  . Typhoid Vaccines Rash and Other (See Comments)    "fever"     Review of Systems: All systems reviewed and negative except where noted in HPI.   Last labs done at [facility] 9/19 - reviewed in care everywhere. CA 19-9 normal. Chronic elevation AP to 200s and  ALT elevated.  Lab Results  Component Value Date   ALT 52 05/02/2015   AST 31 05/02/2015   ALKPHOS 214 (H) 05/02/2015   BILITOT 0.6 05/02/2015    Lab Results  Component Value Date   CREATININE 0.85 05/02/2015   BUN 15 05/02/2015   NA 139 05/02/2015   K 4.2 05/02/2015   CL 103 05/02/2015   CO2 29 05/02/2015    Lab Results  Component Value Date   WBC 7.6 05/02/2015   HGB 13.5 05/02/2015   HCT 39.5 05/02/2015   MCV 84.1 05/02/2015   PLT 347.0 05/02/2015      Physical Exam: BP 114/64   Pulse 88   Ht 5' 10.5" (1.791 m)   Wt 234 lb 8 oz (106.4 kg)   BMI 33.17 kg/m  Constitutional: Pleasant,well-developed, male in no acute distress. HEENT: Normocephalic and atraumatic. Conjunctivae are normal. No scleral icterus. Neck supple.  Cardiovascular: Normal rate, regular rhythm.  Pulmonary/chest: Effort normal and breath sounds normal. No wheezing, rales or rhonchi. Abdominal: Soft, nondistended, nontender. There are no masses palpable. No hepatomegaly. Extremities: no edema Lymphadenopathy: No cervical adenopathy noted. Neurological: Alert and oriented to person place and time. Skin: Skin is warm and dry. No rashes noted. Psychiatric: Normal mood and affect. Behavior is normal.   ASSESSMENT AND PLAN: 70 year old male here for reassessment of what appears to be Crohn's colitis and PSC.  Crohn's colitis - symptoms well controlled on maintenance Lialda dosing. He was previously on low-dose 6-MP with subtherapeutic 6-TG levels thus this was stopped. He has no active symptoms and no anemia at this time which is excellent. However he does have significant pseudopolyposis on prior colonoscopies, and with this finding and his underlying PSC, it is recommended he has a colonoscopy every year. He is overdue for this at present time, last exam was done 2 years ago. I discussed reasoning behind recommending yearly colonoscopy and that this is a strong recommendation. He verbalized  understanding of this recommendation, yet is declining colonoscopy for the foreseeable future. He is concerned about the risks associated with colonoscopy which I outlined with him in detail and in general are extremely rare. I think the benefits of colonoscopy greatly exceeds the risk in his case. Again he continues to decline colonoscopy. If he changes his mind asked him to  call me otherwise also him again in 6 months.   PSC - stable liver function testing, followed by [facility]. They have recommended interval imaging as have I of his liver. He again declines screening / surveillance imaging, despite verbalizing understanding of why this is recommended. CA-19-9 is negative. We'll repeat labs again in 6-12 months.   Of note he has requested VA disability paperwork to be completed for these conditions, I will complete this and sent it in for him  [place] Cellar, MD [facility] Gastroenterology Pager [phone]

## 2015-11-23 DIAGNOSIS — B078 Other viral warts: Secondary | ICD-10-CM | POA: Diagnosis not present

## 2015-11-23 DIAGNOSIS — L304 Erythema intertrigo: Secondary | ICD-10-CM | POA: Diagnosis not present

## 2015-11-23 DIAGNOSIS — L84 Corns and callosities: Secondary | ICD-10-CM | POA: Diagnosis not present

## 2015-11-23 DIAGNOSIS — D485 Neoplasm of uncertain behavior of skin: Secondary | ICD-10-CM | POA: Diagnosis not present

## 2015-11-27 DIAGNOSIS — L400 Psoriasis vulgaris: Secondary | ICD-10-CM | POA: Diagnosis not present

## 2015-12-03 DIAGNOSIS — L400 Psoriasis vulgaris: Secondary | ICD-10-CM | POA: Diagnosis not present

## 2015-12-06 DIAGNOSIS — L400 Psoriasis vulgaris: Secondary | ICD-10-CM | POA: Diagnosis not present

## 2015-12-10 DIAGNOSIS — H5213 Myopia, bilateral: Secondary | ICD-10-CM | POA: Diagnosis not present

## 2015-12-10 DIAGNOSIS — L400 Psoriasis vulgaris: Secondary | ICD-10-CM | POA: Diagnosis not present

## 2015-12-10 DIAGNOSIS — H524 Presbyopia: Secondary | ICD-10-CM | POA: Diagnosis not present

## 2015-12-10 DIAGNOSIS — H2513 Age-related nuclear cataract, bilateral: Secondary | ICD-10-CM | POA: Diagnosis not present

## 2015-12-10 DIAGNOSIS — H52203 Unspecified astigmatism, bilateral: Secondary | ICD-10-CM | POA: Diagnosis not present

## 2015-12-13 DIAGNOSIS — L400 Psoriasis vulgaris: Secondary | ICD-10-CM | POA: Diagnosis not present

## 2015-12-18 DIAGNOSIS — L400 Psoriasis vulgaris: Secondary | ICD-10-CM | POA: Diagnosis not present

## 2015-12-20 DIAGNOSIS — L400 Psoriasis vulgaris: Secondary | ICD-10-CM | POA: Diagnosis not present

## 2015-12-25 DIAGNOSIS — L400 Psoriasis vulgaris: Secondary | ICD-10-CM | POA: Diagnosis not present

## 2016-01-01 DIAGNOSIS — L400 Psoriasis vulgaris: Secondary | ICD-10-CM | POA: Diagnosis not present

## 2016-01-03 DIAGNOSIS — L400 Psoriasis vulgaris: Secondary | ICD-10-CM | POA: Diagnosis not present

## 2016-01-05 ENCOUNTER — Other Ambulatory Visit: Payer: Self-pay | Admitting: Internal Medicine

## 2016-01-08 DIAGNOSIS — L400 Psoriasis vulgaris: Secondary | ICD-10-CM | POA: Diagnosis not present

## 2016-01-10 DIAGNOSIS — L400 Psoriasis vulgaris: Secondary | ICD-10-CM | POA: Diagnosis not present

## 2016-01-14 DIAGNOSIS — L400 Psoriasis vulgaris: Secondary | ICD-10-CM | POA: Diagnosis not present

## 2016-01-17 DIAGNOSIS — L400 Psoriasis vulgaris: Secondary | ICD-10-CM | POA: Diagnosis not present

## 2016-01-21 DIAGNOSIS — L400 Psoriasis vulgaris: Secondary | ICD-10-CM | POA: Diagnosis not present

## 2016-01-28 DIAGNOSIS — L400 Psoriasis vulgaris: Secondary | ICD-10-CM | POA: Diagnosis not present

## 2016-01-31 DIAGNOSIS — L400 Psoriasis vulgaris: Secondary | ICD-10-CM | POA: Diagnosis not present

## 2016-02-04 DIAGNOSIS — L400 Psoriasis vulgaris: Secondary | ICD-10-CM | POA: Diagnosis not present

## 2016-02-07 DIAGNOSIS — L400 Psoriasis vulgaris: Secondary | ICD-10-CM | POA: Diagnosis not present

## 2016-02-09 ENCOUNTER — Other Ambulatory Visit: Payer: Self-pay | Admitting: Internal Medicine

## 2016-02-11 DIAGNOSIS — L400 Psoriasis vulgaris: Secondary | ICD-10-CM | POA: Diagnosis not present

## 2016-02-14 DIAGNOSIS — L400 Psoriasis vulgaris: Secondary | ICD-10-CM | POA: Diagnosis not present

## 2016-02-18 DIAGNOSIS — L400 Psoriasis vulgaris: Secondary | ICD-10-CM | POA: Diagnosis not present

## 2016-02-21 DIAGNOSIS — L400 Psoriasis vulgaris: Secondary | ICD-10-CM | POA: Diagnosis not present

## 2016-02-25 DIAGNOSIS — L400 Psoriasis vulgaris: Secondary | ICD-10-CM | POA: Diagnosis not present

## 2016-02-28 DIAGNOSIS — L400 Psoriasis vulgaris: Secondary | ICD-10-CM | POA: Diagnosis not present

## 2016-03-04 DIAGNOSIS — L400 Psoriasis vulgaris: Secondary | ICD-10-CM | POA: Diagnosis not present

## 2016-03-07 DIAGNOSIS — L400 Psoriasis vulgaris: Secondary | ICD-10-CM | POA: Diagnosis not present

## 2016-03-10 DIAGNOSIS — L400 Psoriasis vulgaris: Secondary | ICD-10-CM | POA: Diagnosis not present

## 2016-03-12 NOTE — Progress Notes (Signed)
Pre visit review using our clinic review tool, if applicable. No additional management support is needed unless otherwise documented below in the visit note. 

## 2016-03-12 NOTE — Progress Notes (Addendum)
Subjective:   Mark [name] is a 71 y.o. male who presents for an Initial Medicare Annual Wellness Visit.  Review of Systems  No ROS.  Medicare Wellness Visit.  Cardiac Risk Factors include: male gender;advanced age (>28mn, >>53women);hypertension;dyslipidemia Sleep patterns: no sleep issues, feels rested on waking, gets up 1-2 times nightly to void and sleeps 7-8 hours nightly.   Home Safety/Smoke Alarms: Feels safe in home. Smoke alarms in place.    Living environment; residence and Firearm Safety: 2[place]afety/Bike Helmet: Wears seat belt.   Counseling:   Eye Exam- yearly eye exam Dr. [facility]- every 6 months dental and parodontal exams  Male:   CCS- Last 11/17/13, polyps, recall 3-5 years     PSA-  Lab Results  Component Value Date   PSA 2.01 05/02/2015   PSA 1.22 06/27/2014   PSA 2.10 04/15/2013       Objective:    Today's Vitals   03/13/16 1019  BP: (!) 154/82  Pulse: 64  Resp: 18  SpO2: 98%  Weight: 241 lb (109.3 kg)  Height: 5' 11"  (1.803 m)   Body mass index is 33.61 kg/m.  Current Medications (verified) Outpatient Encounter Prescriptions as of 03/13/2016  Medication Sig  . Ascorbic Acid (VITAMIN C PO) Take 1 capsule by mouth daily.    .Marland Kitchenaspirin 81 MG EC tablet TAKE 1 TABLET (81 MG TOTAL) BY MOUTH DAILY.  .Marland Kitchenb complex vitamins tablet Take 1 tablet by mouth daily.    . Calcium Carbonate-Vitamin D (CALCIUM + D PO) Take 1 capsule by mouth 2 (two) times daily.    . Iodoquinol-HC (HYDROCORTISONE-IODOQUINOL) 1-1 % CREA   . irbesartan (AVAPRO) 300 MG tablet TAKE 1 TABLET BY MOUTH EVERY DAY  . mesalamine (LIALDA) 1.2 g EC tablet TAKE 2 TABLETS BY MOUTH DAILY WITH BREAKFAST  . Multiple Vitamins-Minerals (CENTRUM PO) Take 1 tablet by mouth daily.    . Omega-3 Fatty Acids (FISH OIL CONCENTRATE PO) Take 1 capsule by mouth daily.    .Marland Kitchentriamcinolone cream (KENALOG) 0.1 % APPLY ON THE SKIN TWICE  A DAY  . VITAMIN A PO Take 1 capsule by mouth daily.   . vitamin E 400 UNIT capsule Take 400 Units by mouth daily.   No facility-administered encounter medications on file as of 03/13/2016.     Allergies (verified) Penicillins and Typhoid vaccines   History: Past Medical History:  Diagnosis Date  . Abnormal LFTs   . Benign prostatic hypertrophy   . Cholelithiasis    asymptomatic  . Crohn disease ([place]   . Hemolytic anemia ([place]   . Hyperglycemia 05/03/2015  . Hypertension   . Kidney stones    Kidney stones  . Psoriasis   . Sclerosing cholangitis    Past Surgical History:  Procedure Laterality Date  . CYST REMOVAL WITH BONE GRAFT Right   . Dental Implant    . HAMMER TOE SURGERY    . HAND SURGERY     cyst removal- left hand secondary to broken finger  . LUMBAR LAMINECTOMY    . STAPEDECTOMY     Family History  Problem Relation Age of Onset  . Thyroid cancer Mother   . Lung cancer Father   . Breast cancer      aunt  . Lung cancer      aunt  . Clotting disorder      uncle  . Diabetes Brother   . Colon cancer  Neg Hx    Social History   Occupational History  . Retired Retired   Social History Main Topics  . Smoking status: Former Smoker    Quit date: 01/06/1997  . Smokeless tobacco: Never Used  . Alcohol use 21.0 oz/week    35 Standard drinks or equivalent per week     Comment: occassional  . Drug use: No  . Sexual activity: Not on file   Tobacco Counseling Counseling given: Not Answered   Activities of Daily Living In your present state of health, do you have any difficulty performing the following activities: 03/13/2016  Hearing? N  Vision? N  Difficulty concentrating or making decisions? N  Walking or climbing stairs? N  Dressing or bathing? N  Doing errands, shopping? N  Preparing Food and eating ? N  Using the Toilet? N  In the past six months, have you accidently leaked urine? N  Do you have problems with loss of bowel control? N  Managing your  Medications? N  Managing your Finances? N  Housekeeping or managing your Housekeeping? N  Some recent data might be hidden    Immunizations and Health Maintenance Immunization History  Administered Date(s) Administered  . Hep A / Hep B 07/17/2014, 08/18/2014, 01/18/2015  . Influenza Split 11/13/2010  . Influenza Whole 11/06/2005, 10/28/2007, 10/19/2008, 11/13/2009  . Influenza, High Dose Seasonal PF 11/03/2012, 11/10/2013, 11/10/2013  . Influenza,inj,Quad PF,36+ Mos 10/11/2015  . Influenza-Unspecified 09/07/2014  . Pneumococcal Conjugate-13 04/15/2013  . Pneumococcal Polysaccharide-23 11/06/2005, 06/27/2014  . Td 01/31/2010  . Zoster 07/14/2007   There are no preventive care reminders to display for this patient.  Patient Care Team: Biagio Borg, MD as PCP - General (Internal Medicine) Jarome Matin, MD as Consulting Physician (Dermatology) Manus Gunning, MD as Consulting Physician (Gastroenterology)  Indicate any recent Medical Services you may have received from other than [facility] providers in the past year (date may be approximate).    Assessment:   This is a routine wellness examination for Mark [name]. Physical assessment deferred to PCP.   Hearing/Vision screen Hearing Screening Comments: Able to hear conversational tones w/o difficulty. No issues reported.  Reports wax build-up at times Vision Screening Comments: Wears glasses, has cataracts that are being monitored  Dietary issues and exercise activities discussed: Current Exercise Habits: The patient does not participate in regular exercise at present (states that he becomes more active with warmer weather), Exercise limited by: None identified (Discussed [employer] program)  Diet (meal preparation, eat out, water intake, caffeinated beverages, dairy products, fruits and vegetables): Patient states in general, a "healthy" diet   Drinks 4 cups of water per day. Caffeine intake of soda, coffee, tea. States he is  decreasing carbohydrate intake.   Discussed reading food labels to monitor salt and carbohydrates. Encouraged patient to decrease caffeine drinks and increase water intake. Educated patient regarding portion size reduction of unhealthy foods.    Goals    . maintain my current health status          Start to think about eating healthier and increasing daily water intake.      Depression Screen PHQ 2/9 Scores 03/13/2016 05/02/2015 06/27/2014 04/15/2013  PHQ - 2 Score 0 0 0 0    Fall Risk Fall Risk  03/13/2016 05/02/2015 06/27/2014 04/15/2013  Falls in the past year? No No No No    Cognitive Function:        Screening Tests Health Maintenance  Topic Date Due  . COLONOSCOPY  11/18/2016  .  TETANUS/TDAP  02/01/2020  . INFLUENZA VACCINE  Completed  . Hepatitis C Screening  Completed  . PNA vac Low Risk Adult  Completed        Plan:    Start to eat heart healthy diet (full of fruits, vegetables, whole grains, lean protein, water--limit salt, fat, and sugar intake) and increase physical activity as tolerated.  Continue doing brain stimulating activities (puzzles, reading, adult coloring books, staying active) to keep memory sharp.   During the course of the visit Adynn was educated and counseled about the following appropriate screening and preventive services:   Vaccines to include Pneumoccal, Influenza, Hepatitis B, Td, Zostavax, HCV  Colorectal cancer screening  Cardiovascular disease screening  Diabetes screening  Glaucoma screening  Nutrition counseling  Prostate cancer screening  Patient Instructions (the written plan) were given to the patient.   Michiel Cowboy, RN   03/13/2016    Medical screening examination/treatment/procedure(s) were performed by non-physician practitioner and as supervising physician I was immediately available for consultation/collaboration. I agree with above. Cathlean Cower, MD

## 2016-03-13 ENCOUNTER — Ambulatory Visit (INDEPENDENT_AMBULATORY_CARE_PROVIDER_SITE_OTHER): Payer: Medicare Other | Admitting: *Deleted

## 2016-03-13 VITALS — BP 154/82 | HR 64 | Resp 18 | Ht 71.0 in | Wt 241.0 lb

## 2016-03-13 DIAGNOSIS — Z0000 Encounter for general adult medical examination without abnormal findings: Secondary | ICD-10-CM | POA: Diagnosis not present

## 2016-03-13 NOTE — Patient Instructions (Addendum)
Continue to eat heart healthy diet (full of fruits, vegetables, whole grains, lean protein, water--limit salt, fat, and sugar intake) and increase physical activity as tolerated.  Continue doing brain stimulating activities (puzzles, reading, adult coloring books, staying active) to keep memory sharp.    Mark [name] , Thank you for taking time to come for your Medicare Wellness Visit. I appreciate your ongoing commitment to your health goals. Please review the following plan we discussed and let me know if I can assist you in the future.   These are the goals we discussed: Goals    . maintain my current health status          Start to think about eating healthier and increasing daily water intake.       This is a list of the screening recommended for you and due dates:  Health Maintenance  Topic Date Due  . Colon Cancer Screening  11/18/2016  . Tetanus Vaccine  02/01/2020  . Flu Shot  Completed  .  Hepatitis C: One time screening is recommended by Center for Disease Control  (CDC) for  adults born from [birth year] through 1963.   Completed  . Pneumonia vaccines  Completed

## 2016-03-14 DIAGNOSIS — L400 Psoriasis vulgaris: Secondary | ICD-10-CM | POA: Diagnosis not present

## 2016-03-17 DIAGNOSIS — L400 Psoriasis vulgaris: Secondary | ICD-10-CM | POA: Diagnosis not present

## 2016-03-20 DIAGNOSIS — L400 Psoriasis vulgaris: Secondary | ICD-10-CM | POA: Diagnosis not present

## 2016-03-24 DIAGNOSIS — L400 Psoriasis vulgaris: Secondary | ICD-10-CM | POA: Diagnosis not present

## 2016-03-27 DIAGNOSIS — L400 Psoriasis vulgaris: Secondary | ICD-10-CM | POA: Diagnosis not present

## 2016-03-31 DIAGNOSIS — L400 Psoriasis vulgaris: Secondary | ICD-10-CM | POA: Diagnosis not present

## 2016-04-03 DIAGNOSIS — L400 Psoriasis vulgaris: Secondary | ICD-10-CM | POA: Diagnosis not present

## 2016-04-07 DIAGNOSIS — L400 Psoriasis vulgaris: Secondary | ICD-10-CM | POA: Diagnosis not present

## 2016-04-10 DIAGNOSIS — L400 Psoriasis vulgaris: Secondary | ICD-10-CM | POA: Diagnosis not present

## 2016-04-14 DIAGNOSIS — L400 Psoriasis vulgaris: Secondary | ICD-10-CM | POA: Diagnosis not present

## 2016-04-17 DIAGNOSIS — L400 Psoriasis vulgaris: Secondary | ICD-10-CM | POA: Diagnosis not present

## 2016-04-23 DIAGNOSIS — L400 Psoriasis vulgaris: Secondary | ICD-10-CM | POA: Diagnosis not present

## 2016-04-25 DIAGNOSIS — L400 Psoriasis vulgaris: Secondary | ICD-10-CM | POA: Diagnosis not present

## 2016-04-27 ENCOUNTER — Other Ambulatory Visit: Payer: Self-pay | Admitting: Internal Medicine

## 2016-04-28 DIAGNOSIS — L400 Psoriasis vulgaris: Secondary | ICD-10-CM | POA: Diagnosis not present

## 2016-05-01 DIAGNOSIS — L400 Psoriasis vulgaris: Secondary | ICD-10-CM | POA: Diagnosis not present

## 2016-05-12 DIAGNOSIS — L400 Psoriasis vulgaris: Secondary | ICD-10-CM | POA: Diagnosis not present

## 2016-05-15 DIAGNOSIS — L400 Psoriasis vulgaris: Secondary | ICD-10-CM | POA: Diagnosis not present

## 2016-05-19 ENCOUNTER — Encounter: Payer: Self-pay | Admitting: Gastroenterology

## 2016-05-19 ENCOUNTER — Encounter (INDEPENDENT_AMBULATORY_CARE_PROVIDER_SITE_OTHER): Payer: Self-pay

## 2016-05-19 ENCOUNTER — Other Ambulatory Visit (INDEPENDENT_AMBULATORY_CARE_PROVIDER_SITE_OTHER): Payer: Medicare Other

## 2016-05-19 ENCOUNTER — Ambulatory Visit (INDEPENDENT_AMBULATORY_CARE_PROVIDER_SITE_OTHER): Payer: Medicare Other | Admitting: Gastroenterology

## 2016-05-19 VITALS — BP 116/68 | HR 72 | Ht 70.5 in | Wt 234.4 lb

## 2016-05-19 DIAGNOSIS — K5010 Crohn's disease of large intestine without complications: Secondary | ICD-10-CM | POA: Diagnosis not present

## 2016-05-19 DIAGNOSIS — K50119 Crohn's disease of large intestine with unspecified complications: Secondary | ICD-10-CM

## 2016-05-19 DIAGNOSIS — L400 Psoriasis vulgaris: Secondary | ICD-10-CM | POA: Diagnosis not present

## 2016-05-19 DIAGNOSIS — K830 Cholangitis: Secondary | ICD-10-CM | POA: Diagnosis not present

## 2016-05-19 DIAGNOSIS — K8301 Primary sclerosing cholangitis: Secondary | ICD-10-CM

## 2016-05-19 LAB — CBC WITH DIFFERENTIAL/PLATELET
Basophils Absolute: 0 10*3/uL (ref 0.0–0.1)
Basophils Relative: 0.3 % (ref 0.0–3.0)
EOS ABS: 0.4 10*3/uL (ref 0.0–0.7)
Eosinophils Relative: 6.3 % — ABNORMAL HIGH (ref 0.0–5.0)
HCT: 42.8 % (ref 39.0–52.0)
HEMOGLOBIN: 14.5 g/dL (ref 13.0–17.0)
LYMPHS ABS: 1.4 10*3/uL (ref 0.7–4.0)
Lymphocytes Relative: 19.5 % (ref 12.0–46.0)
MCHC: 33.9 g/dL (ref 30.0–36.0)
MCV: 87.7 fl (ref 78.0–100.0)
MONO ABS: 0.8 10*3/uL (ref 0.1–1.0)
Monocytes Relative: 11.4 % (ref 3.0–12.0)
NEUTROS PCT: 62.5 % (ref 43.0–77.0)
Neutro Abs: 4.4 10*3/uL (ref 1.4–7.7)
Platelets: 335 10*3/uL (ref 150.0–400.0)
RBC: 4.88 Mil/uL (ref 4.22–5.81)
RDW: 13.4 % (ref 11.5–15.5)
WBC: 7.1 10*3/uL (ref 4.0–10.5)

## 2016-05-19 LAB — COMPREHENSIVE METABOLIC PANEL
ALBUMIN: 3.8 g/dL (ref 3.5–5.2)
ALK PHOS: 278 U/L — AB (ref 39–117)
ALT: 59 U/L — ABNORMAL HIGH (ref 0–53)
AST: 45 U/L — AB (ref 0–37)
BUN: 12 mg/dL (ref 6–23)
CHLORIDE: 102 meq/L (ref 96–112)
CO2: 28 mEq/L (ref 19–32)
Calcium: 9.7 mg/dL (ref 8.4–10.5)
Creatinine, Ser: 0.86 mg/dL (ref 0.40–1.50)
GFR: 93.72 mL/min (ref >60.00)
Glucose, Bld: 94 mg/dL (ref 70–99)
POTASSIUM: 4.3 meq/L (ref 3.5–5.1)
SODIUM: 136 meq/L (ref 135–145)
TOTAL PROTEIN: 7.7 g/dL (ref 6.0–8.3)
Total Bilirubin: 0.7 mg/dL (ref 0.2–1.2)

## 2016-05-19 LAB — VITAMIN D 25 HYDROXY (VIT D DEFICIENCY, FRACTURES): VITD: 77.83 ng/mL (ref 30.00–100.00)

## 2016-05-19 MED ORDER — MESALAMINE 1.2 G PO TBEC: DELAYED_RELEASE_TABLET | ORAL | 3 refills | Status: DC

## 2016-05-19 NOTE — Patient Instructions (Addendum)
If you are age 69 or older, your body mass index should be between 23-30. Your Body mass index is 33.15 kg/m. If this is out of the aforementioned range listed, please consider follow up with your Primary Care Provider.  If you are age 14 or younger, your body mass index should be between 19-25. Your Body mass index is 33.15 kg/m. If this is out of the aformentioned range listed, please consider follow up with your Primary Care Provider.   Your physician has requested that you go to the basement for lab work before leaving today.  We have sent the following medications to your pharmacy for you to pick up at your convenience:  Lialda  Thank you.

## 2016-05-19 NOTE — Progress Notes (Signed)
HPI :  Crohn's / [place] history: Diagnosed with Crohns colitis in 1999 per Dr. Olevia Perches (thought Crohns due to appearance and ? Rectal sparing in the past?), also with history of PSC. He thinks he was diagnosed with [place] around 10 years ago or so. He had been followed at [facility], He had an MRI of his liver in spring 2016. It was recommended that he have a colonoscopy and MRI of his liver every year or so, which he states he has declined to do.   Regarding his colitis history, was initially on Asacol, and then switched to Lialda. He has never been on Remicade or Humira, or other anti-TNFs. He reports a history of a colitis flare in 1999, his last hospitalization. 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 He had been on 6MP 38m per day longstanding - but it was stopped, no longer an issue.   Last colonoscopy: 11/18/2013 - multiple large pseudopolyps, no active inflammation, no dysplasia   SINCE LAST VISIT:  Last labs 09/2015 in Care everywhere  He is taking 2 tablets per day of Lialda. He is having about one BM per day. No blood in the stools. No abdominal pains.  Weight stable, no weight loss. Eating okay, no nausea or vomiting.  He is scheduled to see [facility] in September per Dr[facility]   He wants to hold off on colonoscopy for at least 5 years from his last exam. He has declined annual exams and I again reviewed reasoning behind the recommendations. He also is now considering MRI but wishes to hold off at this time, and await his follow up with [facility] He's taking calcium with vitamin D. T score 0.9 on DEXA last year.      Past Medical History:  Diagnosis Date  . Abnormal LFTs   . Benign prostatic hypertrophy   . Cholelithiasis    asymptomatic  . Crohn disease ([place]   . Hemolytic anemia ([place]   . Hyperglycemia 05/03/2015  . Hypertension   . Kidney stones    Kidney  stones  . Psoriasis   . Sclerosing cholangitis      Past Surgical History:  Procedure Laterality Date  . CYST REMOVAL WITH BONE GRAFT Right   . Dental Implant    . HAMMER TOE SURGERY    . HAND SURGERY     cyst removal- left hand secondary to broken finger  . LUMBAR LAMINECTOMY    . STAPEDECTOMY     Family History  Problem Relation Age of Onset  . Thyroid cancer Mother   . Lung cancer Father   . Breast cancer Unknown        aunt  . Lung cancer Unknown        aunt  . Clotting disorder Unknown        uncle  . Diabetes Brother   . Colon cancer Neg Hx    Social History  Substance Use Topics  . Smoking status: Former Smoker    Quit date: 01/06/1997  . Smokeless tobacco: Never Used  . Alcohol use 21.0 oz/week    35 Standard drinks or equivalent per week     Comment: occassional   Current Outpatient Prescriptions  Medication Sig Dispense Refill  . Ascorbic Acid (VITAMIN C PO) Take 1 capsule by mouth daily.      .Marland Kitchenaspirin 81 MG EC tablet TAKE  1 TABLET (81 MG TOTAL) BY MOUTH DAILY. 90 tablet 0  . b complex vitamins tablet Take 1 tablet by mouth daily.      . Calcium Carbonate-Vitamin D (CALCIUM + D PO) Take 1 capsule by mouth 2 (two) times daily.      . Iodoquinol-HC (HYDROCORTISONE-IODOQUINOL) 1-1 % CREA   3  . irbesartan (AVAPRO) 300 MG tablet Take 1 tablet (300 mg total) by mouth daily. Yearly physical w/labs is due must see Md for refills 30 tablet 0  . mesalamine (LIALDA) 1.2 g EC tablet TAKE 2 TABLETS BY MOUTH DAILY WITH BREAKFAST 180 tablet 3  . Multiple Vitamins-Minerals (CENTRUM PO) Take 1 tablet by mouth daily.      . Omega-3 Fatty Acids (FISH OIL CONCENTRATE PO) Take 1 capsule by mouth daily.      Marland Kitchen triamcinolone cream (KENALOG) 0.1 % APPLY ON THE SKIN TWICE A DAY  2  . VITAMIN A PO Take 1 capsule by mouth daily.     . vitamin E 400 UNIT capsule Take 400 Units by mouth daily.     No current facility-administered medications for this visit.    Allergies  Allergen  Reactions  . Penicillins Hives    REACTION: hives  . Typhoid Vaccines Rash and Other (See Comments)    "fever"     Review of Systems: All systems reviewed and negative except where noted in HPI.    Labs in Care everywhere 09/2015  Physical Exam: BP 116/68 (BP Location: Left Arm, Patient Position: Sitting, Cuff Size: Normal)   Pulse 72   Ht 5' 10.5" (1.791 m)   Wt 234 lb 6 oz (106.3 kg)   BMI 33.15 kg/m  Constitutional: Pleasant,well-developed, male in no acute distress. HEENT: Normocephalic and atraumatic. Conjunctivae are normal. No scleral icterus. Neck supple.  Cardiovascular: Normal rate, regular rhythm.  Pulmonary/chest: Effort normal and breath sounds normal. No wheezing, rales or rhonchi. Abdominal: Soft, protuberant, nontender.  There are no masses palpable. No hepatomegaly. Extremities: no edema Lymphadenopathy: No cervical adenopathy noted. Neurological: Alert and oriented to person place and time. Skin: Skin is warm and dry. No rashes noted. Psychiatric: Normal mood and affect. Behavior is normal.   ASSESSMENT AND PLAN: 71 year old male here for reassessment following issues:  Crohn's colitis - symptoms well controlled on Lialda monotherapy. Due for baseline labs she is agreeable to have done today. He does have significant pseudopolyposis on prior colonoscopies, and with this finding and his underlying PSC, it is recommended he has a colonoscopy every year. He is overdue for this at present time, last exam was done 3 years ago. I discussed reasoning behind recommending yearly colonoscopy and that this is a strong recommendation. He verbalized understanding of this recommendation, yet is declining colonoscopy at this time. Will await labs, will also screen for vitamin D deficiency.  PSC - due for baseline labs, he is agreeable to this today as well as CA-19-9 level. Followed by [facility] hepatology, scheduled to see them next September. Recommended he have interval imaging  of his liver to screen for malignancy which he has declined, he'll discuss this again with hepatology.  He can follow-up with me in 6 months for reassessment or sooner if any questions or concerns, or if he changes his mind about colonoscopy / liver imaging.  [place] Cellar, MD [facility] Gastroenterology Pager [phone]

## 2016-05-20 LAB — CANCER ANTIGEN 19-9: CA 19 9: 12 U/mL (ref <34)

## 2016-05-22 ENCOUNTER — Ambulatory Visit (INDEPENDENT_AMBULATORY_CARE_PROVIDER_SITE_OTHER): Payer: Medicare Other | Admitting: Internal Medicine

## 2016-05-22 ENCOUNTER — Encounter: Payer: Self-pay | Admitting: Internal Medicine

## 2016-05-22 ENCOUNTER — Other Ambulatory Visit (INDEPENDENT_AMBULATORY_CARE_PROVIDER_SITE_OTHER): Payer: Medicare Other

## 2016-05-22 VITALS — BP 126/84 | HR 69 | Ht 72.0 in | Wt 236.0 lb

## 2016-05-22 DIAGNOSIS — E785 Hyperlipidemia, unspecified: Secondary | ICD-10-CM | POA: Diagnosis not present

## 2016-05-22 DIAGNOSIS — N320 Bladder-neck obstruction: Secondary | ICD-10-CM

## 2016-05-22 DIAGNOSIS — H9193 Unspecified hearing loss, bilateral: Secondary | ICD-10-CM | POA: Diagnosis not present

## 2016-05-22 DIAGNOSIS — R5381 Other malaise: Secondary | ICD-10-CM | POA: Diagnosis not present

## 2016-05-22 DIAGNOSIS — L400 Psoriasis vulgaris: Secondary | ICD-10-CM | POA: Diagnosis not present

## 2016-05-22 DIAGNOSIS — I10 Essential (primary) hypertension: Secondary | ICD-10-CM

## 2016-05-22 DIAGNOSIS — R739 Hyperglycemia, unspecified: Secondary | ICD-10-CM | POA: Diagnosis not present

## 2016-05-22 LAB — PSA: PSA: 1.75 ng/mL (ref 0.10–4.00)

## 2016-05-22 MED ORDER — ASPIRIN 81 MG PO TBEC
DELAYED_RELEASE_TABLET | ORAL | 11 refills | Status: DC
Start: 2016-05-22 — End: 2017-04-07

## 2016-05-22 MED ORDER — IRBESARTAN 300 MG PO TABS: 300.0000 mg | ORAL_TABLET | Freq: Every day | ORAL | 3 refills | Status: DC

## 2016-05-22 NOTE — Patient Instructions (Signed)
Your ears were irrigated of wax today  Your parking application was signed today  Please continue all other medications as before, and refills have been done if requested.  Please have the pharmacy call with any other refills you may need.  Please continue your efforts at being more active, low cholesterol diet, and weight control.  You are otherwise up to date with prevention measures today.  Please keep your appointments with your specialists as you may have planned  Please go to the LAB in the Basement (turn left off the elevator) for the tests to be done today - just the PSA  You will be contacted by phone if any changes need to be made immediately.  Otherwise, you will receive a letter about your results with an explanation, but please check with MyChart first.  Please remember to sign up for MyChart if you have not done so, as this will be important to you in the future with finding out test results, communicating by private email, and scheduling acute appointments online when needed.  If you have Medicare related insurance (such as traditional Medicare, [employer] or [employer], or similar), Please make an appointment at the [facility] with Sharee Pimple, the [employer], for your Wellness Visit in this office, which is a benefit with your insurance.  Please return in 1 year for your yearly visit, or sooner if needed

## 2016-05-22 NOTE — Progress Notes (Signed)
Subjective:    Patient ID: Mark [name], male    DOB: Oct 30, 1945, 71 y.o.   MRN: [id number]  HPI  Here for yearly f/u;  Overall doing ok;  Pt denies Chest pain, worsening SOB, DOE, wheezing, orthopnea, PND, worsening LE edema, palpitations, dizziness or syncope.  Pt denies neurological change such as new headache, facial or extremity weakness.  Pt denies polydipsia, polyuria, or low sugar symptoms. Pt states overall good compliance with treatment and medications, good tolerability, and has been trying to follow appropriate diet.  Pt denies worsening depressive symptoms, suicidal ideation or panic. No fever, night sweats, wt loss, loss of appetite, or other constitutional symptoms.  Pt states good ability with ADL's, has low fall risk, home safety reviewed and adequate, no other significant changes in hearing or vision, and not active with exercise. Denied per VA disability, but he is now for appeal.   C/o general weakness, asks for parking application.  Also has hearing loss bilat he thinks may be due to wax.   Denies urinary symptoms such as dysuria, frequency, urgency, flank pain, hematuria or n/v, fever, chills. Past Medical History:  Diagnosis Date  . Abnormal LFTs   . Benign prostatic hypertrophy   . Cholelithiasis    asymptomatic  . Crohn disease ([place])   . Hemolytic anemia ([place])   . Hyperglycemia 05/03/2015  . Hypertension   . Kidney stones    Kidney stones  . Psoriasis   . Sclerosing cholangitis    Past Surgical History:  Procedure Laterality Date  . CYST REMOVAL WITH BONE GRAFT Right   . Dental Implant    . HAMMER TOE SURGERY    . HAND SURGERY     cyst removal- left hand secondary to broken finger  . LUMBAR LAMINECTOMY    . STAPEDECTOMY      reports that he quit smoking about 19 years ago. He has never used smokeless tobacco. He reports that he drinks about 21.0 oz of alcohol per week . He reports that he does not use drugs. family history includes Diabetes in his  brother; Lung cancer in his father; Thyroid cancer in his mother. Allergies  Allergen Reactions  . Penicillins Hives    REACTION: hives  . Typhoid Vaccines Rash and Other (See Comments)    "fever"   Current Outpatient Prescriptions on File Prior to Visit  Medication Sig Dispense Refill  . Ascorbic Acid (VITAMIN C PO) Take 1 capsule by mouth daily.      Marland Kitchen b complex vitamins tablet Take 1 tablet by mouth daily.      . Calcium Carbonate-Vitamin D (CALCIUM + D PO) Take 1 capsule by mouth 2 (two) times daily.      . Iodoquinol-HC (HYDROCORTISONE-IODOQUINOL) 1-1 % CREA   3  . mesalamine (LIALDA) 1.2 g EC tablet TAKE 2 TABLETS BY MOUTH DAILY WITH BREAKFAST 180 tablet 3  . Multiple Vitamins-Minerals (CENTRUM PO) Take 1 tablet by mouth daily.      . Omega-3 Fatty Acids (FISH OIL CONCENTRATE PO) Take 1 capsule by mouth daily.      Marland Kitchen triamcinolone cream (KENALOG) 0.1 % APPLY ON THE SKIN TWICE A DAY  2  . VITAMIN A PO Take 1 capsule by mouth daily.     . vitamin E 400 UNIT capsule Take 400 Units by mouth daily.     No current facility-administered medications on file prior to visit.     Review of Systems Constitutional: Negative for other unusual diaphoresis, sweats,  appetite or weight changes HENT: Negative for other worsening hearing loss, ear pain, facial swelling, mouth sores or neck stiffness.   Eyes: Negative for other worsening pain, redness or other visual disturbance.  Respiratory: Negative for other stridor or swelling Cardiovascular: Negative for other palpitations or other chest pain  Gastrointestinal: Negative for worsening diarrhea or loose stools, blood in stool, distention or other pain Genitourinary: Negative for hematuria, flank pain or other change in urine volume.  Musculoskeletal: Negative for myalgias or other joint swelling.  Skin: Negative for other color change, or other wound or worsening drainage.  Neurological: Negative for other syncope or numbness. Hematological:  Negative for other adenopathy or swelling Psychiatric/Behavioral: Negative for hallucinations, other worsening agitation, SI, self-injury, or new decreased concentration \All other system neg per pt    Objective:   Physical Exam BP 126/84   Pulse 69   Ht 6' (1.829 m)   Wt 236 lb (107 kg)   SpO2 99%   BMI 32.01 kg/m  VS noted,  Constitutional: Pt is oriented to person, place, and time. Appears well-developed and well-nourished, in no significant distress and comfortable Head: Normocephalic and atraumatic  Eyes: Conjunctivae and EOM are normal. Pupils are equal, round, and reactive to light Right Ear: External ear normal without discharge Left Ear: External ear normal without discharge Bilat wax impactions irrigated clear Nose: Nose without discharge or deformity Mouth/Throat: Oropharynx is without other ulcerations and moist  Neck: Normal range of motion. Neck supple. No JVD present. No tracheal deviation present or significant neck LA or mass Cardiovascular: Normal rate, regular rhythm, normal heart sounds and intact distal pulses.   Pulmonary/Chest: WOB normal and breath sounds without rales or wheezing  Abdominal: Soft. Bowel sounds are normal. NT. No HSM  Musculoskeletal: Normal range of motion. Exhibits no edema Lymphadenopathy: Has no other cervical adenopathy.  Neurological: Pt is alert and oriented to person, place, and time. Pt has normal reflexes. No cranial nerve deficit. Motor grossly intact, Gait intact Skin: Skin is warm and dry. No rash noted or new ulcerations Psychiatric:  Has normal mood and affect. Behavior is normal without agitation No other exam findings  Lab Results  Component Value Date   WBC 7.1 05/19/2016   HGB 14.5 05/19/2016   HCT 42.8 05/19/2016   PLT 335.0 05/19/2016   GLUCOSE 94 05/19/2016   CHOL 243 (H) 05/02/2015   TRIG 143.0 05/02/2015   HDL 62.50 05/02/2015   LDLCALC 152 (H) 05/02/2015   ALT 59 (H) 05/19/2016   AST 45 (H) 05/19/2016   NA  136 05/19/2016   K 4.3 05/19/2016   CL 102 05/19/2016   CREATININE 0.86 05/19/2016   BUN 12 05/19/2016   CO2 28 05/19/2016   TSH 0.63 05/02/2015   PSA 2.01 05/02/2015   HGBA1C 5.2 05/03/2015        Assessment & Plan:

## 2016-05-25 NOTE — Assessment & Plan Note (Signed)
stable overall by history and exam, recent data reviewed with pt, and pt to continue medical treatment as before,  to f/u any worsening symptoms or concerns BP Readings from Last 3 Encounters:  05/22/16 126/84  05/19/16 116/68  03/13/16 (!) 154/82

## 2016-05-25 NOTE — Assessment & Plan Note (Signed)
Pt with recent VA labs, for lower chol diet, declines further labs

## 2016-05-25 NOTE — Assessment & Plan Note (Signed)
For parking application signed

## 2016-05-25 NOTE — Assessment & Plan Note (Signed)
Due to wax and improved with irrigation

## 2016-05-25 NOTE — Assessment & Plan Note (Signed)
Also for psa as he is due,  to f/u any worsening symptoms or concerns  

## 2016-05-25 NOTE — Assessment & Plan Note (Signed)
Asympt, had recent VA labs, declines further lab today, cont wt control and diet

## 2016-05-26 DIAGNOSIS — L400 Psoriasis vulgaris: Secondary | ICD-10-CM | POA: Diagnosis not present

## 2016-05-27 ENCOUNTER — Other Ambulatory Visit: Payer: Self-pay | Admitting: Internal Medicine

## 2016-05-29 DIAGNOSIS — L400 Psoriasis vulgaris: Secondary | ICD-10-CM | POA: Diagnosis not present

## 2016-06-03 DIAGNOSIS — L400 Psoriasis vulgaris: Secondary | ICD-10-CM | POA: Diagnosis not present

## 2016-06-05 DIAGNOSIS — L400 Psoriasis vulgaris: Secondary | ICD-10-CM | POA: Diagnosis not present

## 2016-06-09 DIAGNOSIS — L400 Psoriasis vulgaris: Secondary | ICD-10-CM | POA: Diagnosis not present

## 2016-06-12 DIAGNOSIS — L400 Psoriasis vulgaris: Secondary | ICD-10-CM | POA: Diagnosis not present

## 2016-06-23 DIAGNOSIS — L400 Psoriasis vulgaris: Secondary | ICD-10-CM | POA: Diagnosis not present

## 2016-06-25 ENCOUNTER — Telehealth: Payer: Self-pay

## 2016-06-25 NOTE — Telephone Encounter (Addendum)
Called pt, LVM.   

## 2016-06-25 NOTE — Telephone Encounter (Signed)
Pt returned your call. He can be reached at [phone].

## 2016-06-25 NOTE — Telephone Encounter (Signed)
Called to inform pt that Mark [name] has declined to do a letter for his VA appeal. He states that he does not feel that is is a medical connection with his anemia and his [place] service and would not feel comfortable saying so.   Had to leave a VM for pt to return my call.   (i have pt paperwork on my desk if he needs to pick it up)

## 2016-06-26 DIAGNOSIS — L400 Psoriasis vulgaris: Secondary | ICD-10-CM | POA: Diagnosis not present

## 2016-06-30 DIAGNOSIS — L400 Psoriasis vulgaris: Secondary | ICD-10-CM | POA: Diagnosis not present

## 2016-07-03 DIAGNOSIS — L400 Psoriasis vulgaris: Secondary | ICD-10-CM | POA: Diagnosis not present

## 2016-07-07 DIAGNOSIS — L400 Psoriasis vulgaris: Secondary | ICD-10-CM | POA: Diagnosis not present

## 2016-07-10 DIAGNOSIS — L400 Psoriasis vulgaris: Secondary | ICD-10-CM | POA: Diagnosis not present

## 2016-07-14 ENCOUNTER — Telehealth: Payer: Self-pay | Admitting: Gastroenterology

## 2016-07-14 DIAGNOSIS — L400 Psoriasis vulgaris: Secondary | ICD-10-CM | POA: Diagnosis not present

## 2016-07-14 NOTE — Telephone Encounter (Signed)
Called patient in regards to his request for letters for the VA (at home and cell). Left 2 messages, will call him back again

## 2016-07-15 NOTE — Telephone Encounter (Signed)
Patient returned phone call. Best # [phone]

## 2016-07-16 NOTE — Telephone Encounter (Signed)
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 His diagnosis of Crohns came much later after his [place], and agent orange is not recognized by the [place] as a potential cause of Crohns / [place] at this time. I have a hard time writing a letter in support of a claim that his Crohns was caused by agent orange exposure at this time until more data is available to suggest a link between the two. He verbalized understanding.

## 2016-07-17 DIAGNOSIS — L400 Psoriasis vulgaris: Secondary | ICD-10-CM | POA: Diagnosis not present

## 2016-07-18 ENCOUNTER — Telehealth: Payer: Self-pay | Admitting: *Deleted

## 2016-07-18 NOTE — Telephone Encounter (Signed)
-----   Message from Manus Gunning, MD sent at 07/16/2016  5:41 PM EDT ----- Regarding: paperwork I have paperwork from Mr. Endicott regarding his VA claim - I spoke with him about it, I'm holding off on writing this letter and he would like his paperwork back. Can you mail it back to him? It's on my desk. Thanks!

## 2016-07-18 NOTE — Telephone Encounter (Signed)
Forms have been mailed back to patient's home address.

## 2016-07-21 DIAGNOSIS — L400 Psoriasis vulgaris: Secondary | ICD-10-CM | POA: Diagnosis not present

## 2016-07-23 DIAGNOSIS — L400 Psoriasis vulgaris: Secondary | ICD-10-CM | POA: Diagnosis not present

## 2016-08-04 DIAGNOSIS — L400 Psoriasis vulgaris: Secondary | ICD-10-CM | POA: Diagnosis not present

## 2016-08-07 DIAGNOSIS — L400 Psoriasis vulgaris: Secondary | ICD-10-CM | POA: Diagnosis not present

## 2016-08-11 DIAGNOSIS — L400 Psoriasis vulgaris: Secondary | ICD-10-CM | POA: Diagnosis not present

## 2016-08-14 DIAGNOSIS — L400 Psoriasis vulgaris: Secondary | ICD-10-CM | POA: Diagnosis not present

## 2016-08-18 DIAGNOSIS — L400 Psoriasis vulgaris: Secondary | ICD-10-CM | POA: Diagnosis not present

## 2016-08-21 DIAGNOSIS — L400 Psoriasis vulgaris: Secondary | ICD-10-CM | POA: Diagnosis not present

## 2016-08-26 DIAGNOSIS — L400 Psoriasis vulgaris: Secondary | ICD-10-CM | POA: Diagnosis not present

## 2016-08-28 DIAGNOSIS — L400 Psoriasis vulgaris: Secondary | ICD-10-CM | POA: Diagnosis not present

## 2016-09-01 DIAGNOSIS — L400 Psoriasis vulgaris: Secondary | ICD-10-CM | POA: Diagnosis not present

## 2016-09-04 DIAGNOSIS — L400 Psoriasis vulgaris: Secondary | ICD-10-CM | POA: Diagnosis not present

## 2016-09-09 DIAGNOSIS — L400 Psoriasis vulgaris: Secondary | ICD-10-CM | POA: Diagnosis not present

## 2016-09-11 DIAGNOSIS — L400 Psoriasis vulgaris: Secondary | ICD-10-CM | POA: Diagnosis not present

## 2016-09-15 DIAGNOSIS — L400 Psoriasis vulgaris: Secondary | ICD-10-CM | POA: Diagnosis not present

## 2016-09-18 DIAGNOSIS — L400 Psoriasis vulgaris: Secondary | ICD-10-CM | POA: Diagnosis not present

## 2016-09-22 DIAGNOSIS — L400 Psoriasis vulgaris: Secondary | ICD-10-CM | POA: Diagnosis not present

## 2016-09-25 DIAGNOSIS — L400 Psoriasis vulgaris: Secondary | ICD-10-CM | POA: Diagnosis not present

## 2016-09-29 DIAGNOSIS — L400 Psoriasis vulgaris: Secondary | ICD-10-CM | POA: Diagnosis not present

## 2016-09-30 DIAGNOSIS — L821 Other seborrheic keratosis: Secondary | ICD-10-CM | POA: Diagnosis not present

## 2016-09-30 DIAGNOSIS — L918 Other hypertrophic disorders of the skin: Secondary | ICD-10-CM | POA: Diagnosis not present

## 2016-09-30 DIAGNOSIS — D692 Other nonthrombocytopenic purpura: Secondary | ICD-10-CM | POA: Diagnosis not present

## 2016-09-30 DIAGNOSIS — L400 Psoriasis vulgaris: Secondary | ICD-10-CM | POA: Diagnosis not present

## 2016-09-30 DIAGNOSIS — L570 Actinic keratosis: Secondary | ICD-10-CM | POA: Diagnosis not present

## 2016-10-02 DIAGNOSIS — R978 Other abnormal tumor markers: Secondary | ICD-10-CM | POA: Diagnosis not present

## 2016-10-02 DIAGNOSIS — K830 Cholangitis: Secondary | ICD-10-CM | POA: Diagnosis not present

## 2016-10-02 DIAGNOSIS — R933 Abnormal findings on diagnostic imaging of other parts of digestive tract: Secondary | ICD-10-CM | POA: Diagnosis not present

## 2016-10-03 DIAGNOSIS — L400 Psoriasis vulgaris: Secondary | ICD-10-CM | POA: Diagnosis not present

## 2016-10-06 DIAGNOSIS — L400 Psoriasis vulgaris: Secondary | ICD-10-CM | POA: Diagnosis not present

## 2016-10-09 DIAGNOSIS — L400 Psoriasis vulgaris: Secondary | ICD-10-CM | POA: Diagnosis not present

## 2016-10-13 DIAGNOSIS — L400 Psoriasis vulgaris: Secondary | ICD-10-CM | POA: Diagnosis not present

## 2016-10-14 ENCOUNTER — Ambulatory Visit (INDEPENDENT_AMBULATORY_CARE_PROVIDER_SITE_OTHER): Payer: Medicare Other

## 2016-10-14 DIAGNOSIS — Z23 Encounter for immunization: Secondary | ICD-10-CM

## 2016-10-16 DIAGNOSIS — L400 Psoriasis vulgaris: Secondary | ICD-10-CM | POA: Diagnosis not present

## 2016-10-20 DIAGNOSIS — L400 Psoriasis vulgaris: Secondary | ICD-10-CM | POA: Diagnosis not present

## 2016-10-23 DIAGNOSIS — L400 Psoriasis vulgaris: Secondary | ICD-10-CM | POA: Diagnosis not present

## 2016-10-27 DIAGNOSIS — L400 Psoriasis vulgaris: Secondary | ICD-10-CM | POA: Diagnosis not present

## 2016-10-30 DIAGNOSIS — L400 Psoriasis vulgaris: Secondary | ICD-10-CM | POA: Diagnosis not present

## 2016-11-06 DIAGNOSIS — L400 Psoriasis vulgaris: Secondary | ICD-10-CM | POA: Diagnosis not present

## 2016-11-10 DIAGNOSIS — L400 Psoriasis vulgaris: Secondary | ICD-10-CM | POA: Diagnosis not present

## 2016-11-13 DIAGNOSIS — L400 Psoriasis vulgaris: Secondary | ICD-10-CM | POA: Diagnosis not present

## 2016-11-17 DIAGNOSIS — L400 Psoriasis vulgaris: Secondary | ICD-10-CM | POA: Diagnosis not present

## 2016-11-20 DIAGNOSIS — L400 Psoriasis vulgaris: Secondary | ICD-10-CM | POA: Diagnosis not present

## 2016-11-26 ENCOUNTER — Encounter: Payer: Self-pay | Admitting: Gastroenterology

## 2016-11-26 ENCOUNTER — Ambulatory Visit (INDEPENDENT_AMBULATORY_CARE_PROVIDER_SITE_OTHER): Payer: Medicare Other | Admitting: Gastroenterology

## 2016-11-26 VITALS — BP 126/78 | HR 100 | Ht 70.5 in | Wt 244.0 lb

## 2016-11-26 DIAGNOSIS — K8301 Primary sclerosing cholangitis: Secondary | ICD-10-CM | POA: Diagnosis not present

## 2016-11-26 DIAGNOSIS — K50119 Crohn's disease of large intestine with unspecified complications: Secondary | ICD-10-CM | POA: Diagnosis not present

## 2016-11-26 NOTE — Patient Instructions (Signed)
If you are age 69 or older, your body mass index should be between 23-30. Your Body mass index is 34.52 kg/m. If this is out of the aforementioned range listed, please consider follow up with your Primary Care Provider.  If you are age 30 or younger, your body mass index should be between 19-25. Your Body mass index is 34.52 kg/m. If this is out of the aformentioned range listed, please consider follow up with your Primary Care Provider.   It has been recommended to you by your physician that you have a colonoscopy completed. Per your request, we did not schedule the procedure(s) today. Please contact our office at [phone] should you decide to have the procedure completed.  You will be due for a recall office visit in April of 2019. We will send you a reminder in the mail when it gets closer to that time.  Thank you.

## 2016-11-26 NOTE — Progress Notes (Signed)
HPI :  Crohn's / [place] history: Diagnosed with Crohns colitis in 1999 per Dr. Olevia Perches (thought Crohns due to appearance and ? Rectal sparing in the past?), also with history of PSC. He thinks he was diagnosed with [place] around 11 years ago or so. He had been followed at [facility], Dr. Damita Lack. He had an MRI of his liver in spring 2016. It was recommended that he have a colonoscopy and MRI of his liver every year or so, which he states he has declined to do.   Regarding his colitis history, was initially on Asacol, and then switched to Lialda. He has never been on Remicade or Humira, or other anti-TNFs. He reports a history of a colitis flare in 1999, his last hospitalization. 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   Last colonoscopy: 11/18/2013 - multiple large pseudopolyps, no active inflammation, no dysplasia   SINCE LAST VISIT  [facility].  [facility] AP [facility] 297, AST 50, ALT 57, T bil 1.0, alb 3.4 INR 1.0 CA 19-9 13 CBC - Hgb 14.1, HCT 42.7, plt 382, WBC 7.6  Dr. MDamita Lackordered an MRI / MRCP but he has not had this done on review of chart. He reports generally feeling pretty well. He denies any jaundice, no lower extremity swelling, no ascites. He denies any diarrhea, he has one to 2 formed stools per day. No blood in his stools. Is compliant with Lialda and states is working quite well for him. No weight loss. He is eating well, denies any nausea or vomiting.  He reported a flu shot this year. Is pneumococcal vaccines are up-to-date. Vaccinated to hepatitis A and B. He's taking calcium with vitamin D. T score 0.9 on DEXA last year.    Past Medical History:  Diagnosis Date  . Abnormal LFTs   . Benign prostatic hypertrophy   . Cholelithiasis    asymptomatic  . Crohn disease ([place]   . Hemolytic anemia ([place]   . Hyperglycemia 05/03/2015  . Hypertension   . Kidney stones    Kidney stones  . Psoriasis   . Sclerosing cholangitis      Past Surgical History:  Procedure Laterality Date  . CYST REMOVAL WITH BONE GRAFT Right   . Dental Implant    . HAMMER TOE SURGERY    . HAND SURGERY     cyst removal- left hand secondary to broken finger  . LUMBAR LAMINECTOMY    . STAPEDECTOMY     Family History  Problem Relation Age of Onset  . Thyroid cancer Mother   . Lung cancer Father   . Breast cancer Unknown        aunt  . Lung cancer Unknown        aunt  . Clotting disorder Unknown        uncle  . Diabetes Brother   . Colon cancer Neg Hx    Social History   Tobacco Use  . Smoking status: Former Smoker    Last attempt to quit: 01/06/1997    Years since quitting: 19.9  . Smokeless tobacco: Never Used  Substance Use Topics  . Alcohol use: Yes    Alcohol/week: 21.0 oz    Types: 35 Standard drinks or  equivalent per week    Comment: occassional  . Drug use: No   Current Outpatient Medications  Medication Sig Dispense Refill  . Ascorbic Acid (VITAMIN C PO) Take 1 capsule by mouth daily.      Marland Kitchen aspirin 81 MG EC tablet TAKE 1 TABLET (81 MG TOTAL) BY MOUTH DAILY. 90 tablet 11  . b complex vitamins tablet Take 1 tablet by mouth daily.      . Calcium Carbonate-Vitamin D (CALCIUM + D PO) Take 1 capsule by mouth 2 (two) times daily.      . Iodoquinol-HC (HYDROCORTISONE-IODOQUINOL) 1-1 % CREA   3  . irbesartan (AVAPRO) 300 MG tablet Take 1 tablet (300 mg total) by mouth daily. 90 tablet 3  . mesalamine (LIALDA) 1.2 g EC tablet TAKE 2 TABLETS BY MOUTH DAILY WITH BREAKFAST 180 tablet 3  . Multiple Vitamins-Minerals (CENTRUM PO) Take 1 tablet by mouth daily.      . Omega-3 Fatty Acids (FISH OIL CONCENTRATE PO) Take 1 capsule by mouth daily.      Marland Kitchen triamcinolone cream (KENALOG) 0.1 % APPLY ON THE SKIN TWICE A DAY  2  . VITAMIN A PO  Take 1 capsule by mouth daily.     . vitamin E 400 UNIT capsule Take 400 Units by mouth daily.     No current facility-administered medications for this visit.    Allergies  Allergen Reactions  . Penicillins Hives    REACTION: hives  . Typhoid Vaccines Rash and Other (See Comments)    "fever"     Review of Systems: All systems reviewed and negative except where noted in HPI.    Labs per HPI above  Physical Exam: BP 126/78   Pulse 100   Ht 5' 10.5" (1.791 m)   Wt 244 lb (110.7 kg)   BMI 34.52 kg/m  Constitutional: Pleasant,well-developed, male in no acute distress. HEENT: Normocephalic and atraumatic. Conjunctivae are normal. No scleral icterus. Neck supple.  Cardiovascular: Normal rate, regular rhythm.  Pulmonary/chest: Effort normal and breath sounds normal. No wheezing, rales or rhonchi. Abdominal: Soft, nondistended, nontender. There are no masses palpable. No hepatomegaly. Extremities: no edema Lymphadenopathy: No cervical adenopathy noted. Neurological: Alert and oriented to person place and time. Skin: Skin is warm and dry. No rashes noted. Psychiatric: Normal mood and affect. Behavior is normal.   ASSESSMENT AND PLAN: 71 year old male here for reassessment of the following issues:  Crohn's colitis - clinically doing well on Lialda monotherapy. His recent labs are normal. I counseled him several times that given his underlying PSC, long-standing Crohn's colitis with pseudopolyposis, he is at higher risk for colon cancer. Current guidelines recommend colonoscopy every year, and this is a strong recommendation. He again verbalizes understanding of the recommendation, he understands that he is higher than average risk for colon cancer, he continues to decline colonoscopy. He states he may consider doing another one around 2020 but not sooner. I will see him again in 6 months for reassessment.  PSC - recently seen by Dr. Damita Lack. The patient had declined follow-up imaging  of his liver at the last visit, but is now amenable to MRI/MRCP. He states is not scheduled April, not sure why he doesn't want have it done sooner. His CA-19-9 is stable. Labs up-to-date with ongoing elevation in alkaline phosphatase and ALT. He will continue see Dr. Damita Lack on an annual basis. I think repeating a DEXA scan in 2019 is reasonable. He'll continue calcium and vitamin D. He should avoid alcohol. Vaccines  up to date.  [place]ville Cellar, MD [facility] Pager [phone]  CC: Biagio Borg, MD

## 2016-12-01 DIAGNOSIS — L400 Psoriasis vulgaris: Secondary | ICD-10-CM | POA: Diagnosis not present

## 2016-12-04 DIAGNOSIS — L400 Psoriasis vulgaris: Secondary | ICD-10-CM | POA: Diagnosis not present

## 2016-12-08 DIAGNOSIS — L400 Psoriasis vulgaris: Secondary | ICD-10-CM | POA: Diagnosis not present

## 2016-12-11 DIAGNOSIS — L400 Psoriasis vulgaris: Secondary | ICD-10-CM | POA: Diagnosis not present

## 2016-12-25 DIAGNOSIS — L400 Psoriasis vulgaris: Secondary | ICD-10-CM | POA: Diagnosis not present

## 2016-12-31 DIAGNOSIS — L400 Psoriasis vulgaris: Secondary | ICD-10-CM | POA: Diagnosis not present

## 2017-01-01 DIAGNOSIS — H2513 Age-related nuclear cataract, bilateral: Secondary | ICD-10-CM | POA: Diagnosis not present

## 2017-01-01 DIAGNOSIS — H524 Presbyopia: Secondary | ICD-10-CM | POA: Diagnosis not present

## 2017-01-02 DIAGNOSIS — L400 Psoriasis vulgaris: Secondary | ICD-10-CM | POA: Diagnosis not present

## 2017-01-05 DIAGNOSIS — L400 Psoriasis vulgaris: Secondary | ICD-10-CM | POA: Diagnosis not present

## 2017-01-08 DIAGNOSIS — L400 Psoriasis vulgaris: Secondary | ICD-10-CM | POA: Diagnosis not present

## 2017-01-12 DIAGNOSIS — L400 Psoriasis vulgaris: Secondary | ICD-10-CM | POA: Diagnosis not present

## 2017-01-15 DIAGNOSIS — L400 Psoriasis vulgaris: Secondary | ICD-10-CM | POA: Diagnosis not present

## 2017-01-19 DIAGNOSIS — L400 Psoriasis vulgaris: Secondary | ICD-10-CM | POA: Diagnosis not present

## 2017-01-23 DIAGNOSIS — L400 Psoriasis vulgaris: Secondary | ICD-10-CM | POA: Diagnosis not present

## 2017-01-26 DIAGNOSIS — L400 Psoriasis vulgaris: Secondary | ICD-10-CM | POA: Diagnosis not present

## 2017-01-29 DIAGNOSIS — L400 Psoriasis vulgaris: Secondary | ICD-10-CM | POA: Diagnosis not present

## 2017-02-02 DIAGNOSIS — L400 Psoriasis vulgaris: Secondary | ICD-10-CM | POA: Diagnosis not present

## 2017-02-05 DIAGNOSIS — L400 Psoriasis vulgaris: Secondary | ICD-10-CM | POA: Diagnosis not present

## 2017-02-09 DIAGNOSIS — L400 Psoriasis vulgaris: Secondary | ICD-10-CM | POA: Diagnosis not present

## 2017-02-12 DIAGNOSIS — L400 Psoriasis vulgaris: Secondary | ICD-10-CM | POA: Diagnosis not present

## 2017-02-16 DIAGNOSIS — L400 Psoriasis vulgaris: Secondary | ICD-10-CM | POA: Diagnosis not present

## 2017-02-19 DIAGNOSIS — L400 Psoriasis vulgaris: Secondary | ICD-10-CM | POA: Diagnosis not present

## 2017-02-23 DIAGNOSIS — L400 Psoriasis vulgaris: Secondary | ICD-10-CM | POA: Diagnosis not present

## 2017-02-26 DIAGNOSIS — L400 Psoriasis vulgaris: Secondary | ICD-10-CM | POA: Diagnosis not present

## 2017-03-03 DIAGNOSIS — L400 Psoriasis vulgaris: Secondary | ICD-10-CM | POA: Diagnosis not present

## 2017-03-05 DIAGNOSIS — L400 Psoriasis vulgaris: Secondary | ICD-10-CM | POA: Diagnosis not present

## 2017-03-09 DIAGNOSIS — L400 Psoriasis vulgaris: Secondary | ICD-10-CM | POA: Diagnosis not present

## 2017-03-13 DIAGNOSIS — L400 Psoriasis vulgaris: Secondary | ICD-10-CM | POA: Diagnosis not present

## 2017-03-16 DIAGNOSIS — L400 Psoriasis vulgaris: Secondary | ICD-10-CM | POA: Diagnosis not present

## 2017-03-19 DIAGNOSIS — L400 Psoriasis vulgaris: Secondary | ICD-10-CM | POA: Diagnosis not present

## 2017-03-23 DIAGNOSIS — L400 Psoriasis vulgaris: Secondary | ICD-10-CM | POA: Diagnosis not present

## 2017-03-26 DIAGNOSIS — L400 Psoriasis vulgaris: Secondary | ICD-10-CM | POA: Diagnosis not present

## 2017-03-30 DIAGNOSIS — R933 Abnormal findings on diagnostic imaging of other parts of digestive tract: Secondary | ICD-10-CM | POA: Diagnosis not present

## 2017-03-30 DIAGNOSIS — K8301 Primary sclerosing cholangitis: Secondary | ICD-10-CM | POA: Diagnosis not present

## 2017-03-31 DIAGNOSIS — L400 Psoriasis vulgaris: Secondary | ICD-10-CM | POA: Diagnosis not present

## 2017-04-02 DIAGNOSIS — L400 Psoriasis vulgaris: Secondary | ICD-10-CM | POA: Diagnosis not present

## 2017-04-06 DIAGNOSIS — L400 Psoriasis vulgaris: Secondary | ICD-10-CM | POA: Diagnosis not present

## 2017-04-07 ENCOUNTER — Encounter: Payer: Self-pay | Admitting: Internal Medicine

## 2017-04-07 ENCOUNTER — Other Ambulatory Visit (INDEPENDENT_AMBULATORY_CARE_PROVIDER_SITE_OTHER): Payer: Medicare Other

## 2017-04-07 ENCOUNTER — Ambulatory Visit (INDEPENDENT_AMBULATORY_CARE_PROVIDER_SITE_OTHER): Payer: Medicare Other | Admitting: Internal Medicine

## 2017-04-07 VITALS — BP 126/82 | HR 70 | Temp 98.2°F | Ht 70.5 in | Wt 245.0 lb

## 2017-04-07 DIAGNOSIS — R739 Hyperglycemia, unspecified: Secondary | ICD-10-CM

## 2017-04-07 DIAGNOSIS — E785 Hyperlipidemia, unspecified: Secondary | ICD-10-CM

## 2017-04-07 DIAGNOSIS — N320 Bladder-neck obstruction: Secondary | ICD-10-CM

## 2017-04-07 DIAGNOSIS — I10 Essential (primary) hypertension: Secondary | ICD-10-CM

## 2017-04-07 LAB — CBC WITH DIFFERENTIAL/PLATELET
BASOS PCT: 1 % (ref 0.0–3.0)
Basophils Absolute: 0.1 10*3/uL (ref 0.0–0.1)
EOS PCT: 4.8 % (ref 0.0–5.0)
Eosinophils Absolute: 0.3 10*3/uL (ref 0.0–0.7)
HCT: 43 % (ref 39.0–52.0)
HEMOGLOBIN: 14.8 g/dL (ref 13.0–17.0)
LYMPHS ABS: 1.2 10*3/uL (ref 0.7–4.0)
Lymphocytes Relative: 18 % (ref 12.0–46.0)
MCHC: 34.5 g/dL (ref 30.0–36.0)
MCV: 87.3 fl (ref 78.0–100.0)
MONO ABS: 0.9 10*3/uL (ref 0.1–1.0)
Monocytes Relative: 14.2 % — ABNORMAL HIGH (ref 3.0–12.0)
NEUTROS PCT: 62 % (ref 43.0–77.0)
Neutro Abs: 4.1 10*3/uL (ref 1.4–7.7)
PLATELETS: 362 10*3/uL (ref 150.0–400.0)
RBC: 4.92 Mil/uL (ref 4.22–5.81)
RDW: 13.6 % (ref 11.5–15.5)
WBC: 6.6 10*3/uL (ref 4.0–10.5)

## 2017-04-07 LAB — LIPID PANEL
Cholesterol: 262 mg/dL — ABNORMAL HIGH (ref 0–200)
HDL: 79.7 mg/dL (ref >39.00)
LDL CALC: 166 mg/dL — AB (ref 0–99)
NONHDL: 182.23
Total CHOL/HDL Ratio: 3
Triglycerides: 83 mg/dL (ref 0.0–149.0)
VLDL: 16.6 mg/dL (ref 0.0–40.0)

## 2017-04-07 LAB — BASIC METABOLIC PANEL
BUN: 10 mg/dL (ref 6–23)
CHLORIDE: 100 meq/L (ref 96–112)
CO2: 30 meq/L (ref 19–32)
Calcium: 9.7 mg/dL (ref 8.4–10.5)
Creatinine, Ser: 0.71 mg/dL (ref 0.40–1.50)
GFR: 116.62 mL/min (ref >60.00)
GLUCOSE: 83 mg/dL (ref 70–99)
POTASSIUM: 4.4 meq/L (ref 3.5–5.1)
SODIUM: 136 meq/L (ref 135–145)

## 2017-04-07 LAB — HEPATIC FUNCTION PANEL
ALT: 91 U/L — AB (ref 0–53)
AST: 63 U/L — ABNORMAL HIGH (ref 0–37)
Albumin: 3.6 g/dL (ref 3.5–5.2)
Alkaline Phosphatase: 314 U/L — ABNORMAL HIGH (ref 39–117)
BILIRUBIN DIRECT: 0.3 mg/dL (ref 0.0–0.3)
BILIRUBIN TOTAL: 1 mg/dL (ref 0.2–1.2)
Total Protein: 7.4 g/dL (ref 6.0–8.3)

## 2017-04-07 LAB — TSH: TSH: 1.14 u[IU]/mL (ref 0.35–4.50)

## 2017-04-07 LAB — URINALYSIS, ROUTINE W REFLEX MICROSCOPIC
Bilirubin Urine: NEGATIVE
Hgb urine dipstick: NEGATIVE
Ketones, ur: NEGATIVE
LEUKOCYTES UA: NEGATIVE
Nitrite: NEGATIVE
SPECIFIC GRAVITY, URINE: 1.01 (ref 1.000–1.030)
Total Protein, Urine: NEGATIVE
URINE GLUCOSE: NEGATIVE
Urobilinogen, UA: 0.2 (ref 0.0–1.0)
WBC, UA: NONE SEEN — AB (ref 0–?)
pH: 7 (ref 5.0–8.0)

## 2017-04-07 LAB — PSA: PSA: 1.62 ng/mL (ref 0.10–4.00)

## 2017-04-07 LAB — HEMOGLOBIN A1C: HEMOGLOBIN A1C: 5.1 % (ref 4.6–6.5)

## 2017-04-07 MED ORDER — ASPIRIN 81 MG PO TBEC: DELAYED_RELEASE_TABLET | ORAL | 11 refills | Status: DC

## 2017-04-07 MED ORDER — ZOSTER VAC RECOMB ADJUVANTED 50 MCG/0.5ML IM SUSR: 0.5000 mL | Freq: Once | INTRAMUSCULAR | 1 refills | Status: AC

## 2017-04-07 MED ORDER — IRBESARTAN 300 MG PO TABS: 300.0000 mg | ORAL_TABLET | Freq: Every day | ORAL | 3 refills | Status: DC

## 2017-04-07 MED ORDER — DICLOFENAC SODIUM 1 % TD GEL: 4.0000 g | Freq: Four times a day (QID) | TRANSDERMAL | 5 refills | Status: DC | PRN

## 2017-04-07 NOTE — Progress Notes (Signed)
Subjective:    Patient ID: Mark [name], male    DOB: [birth year], 72 y.o.   MRN: [id number]  HPI   Here to f/u; overall doing ok,  Pt denies chest pain, increasing sob or doe, wheezing, orthopnea, PND, increased LE swelling, palpitations, dizziness or syncope.  Pt denies new neurological symptoms such as new headache, or facial or extremity weakness or numbness.  Pt denies polydipsia, polyuria, or low sugar episode.  Pt states overall good compliance with meds, mostly trying to follow appropriate diet, with wt overall stable,  but little exercise however.Also has also bilat finger joint swelling and pain, cant wear rings, hard to make good grip, worse to first use in the AM, some better later in the day.  No fever, trauma or hx of gout. Past Medical History:  Diagnosis Date  . Abnormal LFTs   . Benign prostatic hypertrophy   . Cholelithiasis    asymptomatic  . Crohn disease ([place])   . Hemolytic anemia ([place])   . Hyperglycemia 05/03/2015  . Hypertension   . Kidney stones    Kidney stones  . Psoriasis   . Sclerosing cholangitis    Past Surgical History:  Procedure Laterality Date  . CYST REMOVAL WITH BONE GRAFT Right   . Dental Implant    . HAMMER TOE SURGERY    . HAND SURGERY     cyst removal- left hand secondary to broken finger  . LUMBAR LAMINECTOMY    . STAPEDECTOMY      reports that he quit smoking about 20 years ago. He has never used smokeless tobacco. He reports that he drinks about 21.0 oz of alcohol per week. He reports that he does not use drugs. family history includes Breast cancer in his unknown relative; Clotting disorder in his unknown relative; Diabetes in his brother; Lung cancer in his father and unknown relative; Thyroid cancer in his mother. Allergies  Allergen Reactions  . Penicillins Hives    REACTION: hives  . Typhoid Vaccines Rash and Other (See Comments)    "fever"   Current Outpatient Medications on File Prior to Visit  Medication Sig Dispense  Refill  . Ascorbic Acid (VITAMIN C PO) Take 1 capsule by mouth daily.      Marland Kitchen b complex vitamins tablet Take 1 tablet by mouth daily.      . Calcium Carbonate-Vitamin D (CALCIUM + D PO) Take 1 capsule by mouth 2 (two) times daily.      . Iodoquinol-HC (HYDROCORTISONE-IODOQUINOL) 1-1 % CREA   3  . mesalamine (LIALDA) 1.2 g EC tablet TAKE 2 TABLETS BY MOUTH DAILY WITH BREAKFAST 180 tablet 3  . Multiple Vitamins-Minerals (CENTRUM PO) Take 1 tablet by mouth daily.      . Omega-3 Fatty Acids (FISH OIL CONCENTRATE PO) Take 1 capsule by mouth daily.      Marland Kitchen triamcinolone cream (KENALOG) 0.1 % APPLY ON THE SKIN TWICE A DAY  2  . VITAMIN A PO Take 1 capsule by mouth daily.     . vitamin E 400 UNIT capsule Take 400 Units by mouth daily.     No current facility-administered medications on file prior to visit.    Review of Systems Constitutional: Negative for other unusual diaphoresis, sweats, appetite or weight changes HENT: Negative for other worsening hearing loss, ear pain, facial swelling, mouth sores or neck stiffness.   Eyes: Negative for other worsening pain, redness or other visual disturbance.  Respiratory: Negative for other stridor or swelling Cardiovascular: Negative  for other palpitations or other chest pain  Gastrointestinal: Negative for worsening diarrhea or loose stools, blood in stool, distention or other pain Genitourinary: Negative for hematuria, flank pain or other change in urine volume.  Musculoskeletal: Negative for myalgias or other joint swelling.  Skin: Negative for other color change, or other wound or worsening drainage.  Neurological: Negative for other syncope or numbness. Hematological: Negative for other adenopathy or swelling Psychiatric/Behavioral: Negative for hallucinations, other worsening agitation, SI, self-injury, or new decreased concentration All other system neg per pt    Objective:   Physical Exam BP 126/82   Pulse 70   Temp 98.2 F (36.8 C) (Oral)    Ht 5' 10.5" (1.791 m)   Wt 245 lb (111.1 kg)   SpO2 98%   BMI 34.66 kg/m   VS noted,  Constitutional: Pt is oriented to person, place, and time. Appears well-developed and well-nourished, in no significant distress and comfortable Head: Normocephalic and atraumatic  Eyes: Conjunctivae and EOM are normal. Pupils are equal, round, and reactive to light Right Ear: External ear normal without discharge Left Ear: External ear normal without discharge Nose: Nose without discharge or deformity Mouth/Throat: Oropharynx is without other ulcerations and moist  Neck: Normal range of motion. Neck supple. No JVD present. No tracheal deviation present or significant neck LA or mass Cardiovascular: Normal rate, regular rhythm, normal heart sounds and intact distal pulses.   Pulmonary/Chest: WOB normal and breath sounds without rales or wheezing  Abdominal: Soft. Bowel sounds are normal. NT. No HSM  Musculoskeletal: Normal range of motion. Exhibits no edema but has multiple OA changes or all finger joints with diffuse very mild swelling Lymphadenopathy: Has no other cervical adenopathy.  Neurological: Pt is alert and oriented to person, place, and time. Pt has normal reflexes. No cranial nerve deficit. Motor grossly intact, Gait intact Skin: Skin is warm and dry. No rash noted or new ulcerations Psychiatric:  Has normal mood and affect. Behavior is normal without agitation No other exam findings     Assessment & Plan:

## 2017-04-07 NOTE — Patient Instructions (Addendum)
Your Shingles shot prescription was sent to the pharmacy  Please take all new medication as prescribed - the voltaren gel as needed for the hand pain  Please continue all other medications as before, and refills have been done if requested.  Please have the pharmacy call with any other refills you may need.  Please continue your efforts at being more active, low cholesterol diet, and weight control.  You are otherwise up to date with prevention measures today.  Please keep your appointments with your specialists as you may have planned  Please go to the LAB in the Basement (turn left off the elevator) for the tests to be done today  You will be contacted by phone if any changes need to be made immediately.  Otherwise, you will receive a letter about your results with an explanation, but please check with MyChart first.  Please remember to sign up for MyChart if you have not done so, as this will be important to you in the future with finding out test results, communicating by private email, and scheduling acute appointments online when needed.  Please return in 1 year for your yearly visit, or sooner if needed

## 2017-04-09 DIAGNOSIS — L400 Psoriasis vulgaris: Secondary | ICD-10-CM | POA: Diagnosis not present

## 2017-04-12 NOTE — Assessment & Plan Note (Signed)
Lab Results  Component Value Date   HGBA1C 5.1 04/07/2017

## 2017-04-12 NOTE — Assessment & Plan Note (Signed)
stable overall by history and exam, recent data reviewed with pt, and pt to continue medical treatment as before,  to f/u any worsening symptoms or concerns, for f/u lab today, goal ldl < 100

## 2017-04-12 NOTE — Assessment & Plan Note (Signed)
Also for psa today as he is due, asympt

## 2017-04-12 NOTE — Assessment & Plan Note (Signed)
stable overall by history and exam, recent data reviewed with pt, and pt to continue medical treatment as before,  to f/u any worsening symptoms or concerns\ BP Readings from Last 3 Encounters:  04/07/17 126/82  11/26/16 126/78  05/22/16 126/84

## 2017-04-13 DIAGNOSIS — L400 Psoriasis vulgaris: Secondary | ICD-10-CM | POA: Diagnosis not present

## 2017-04-16 DIAGNOSIS — L400 Psoriasis vulgaris: Secondary | ICD-10-CM | POA: Diagnosis not present

## 2017-04-20 DIAGNOSIS — L400 Psoriasis vulgaris: Secondary | ICD-10-CM | POA: Diagnosis not present

## 2017-04-23 DIAGNOSIS — L400 Psoriasis vulgaris: Secondary | ICD-10-CM | POA: Diagnosis not present

## 2017-04-27 DIAGNOSIS — L400 Psoriasis vulgaris: Secondary | ICD-10-CM | POA: Diagnosis not present

## 2017-04-30 DIAGNOSIS — L400 Psoriasis vulgaris: Secondary | ICD-10-CM | POA: Diagnosis not present

## 2017-05-05 DIAGNOSIS — L400 Psoriasis vulgaris: Secondary | ICD-10-CM | POA: Diagnosis not present

## 2017-05-07 DIAGNOSIS — L400 Psoriasis vulgaris: Secondary | ICD-10-CM | POA: Diagnosis not present

## 2017-05-08 ENCOUNTER — Other Ambulatory Visit: Payer: Self-pay | Admitting: Gastroenterology

## 2017-05-08 DIAGNOSIS — K50119 Crohn's disease of large intestine with unspecified complications: Secondary | ICD-10-CM

## 2017-05-11 DIAGNOSIS — L400 Psoriasis vulgaris: Secondary | ICD-10-CM | POA: Diagnosis not present

## 2017-05-14 DIAGNOSIS — L400 Psoriasis vulgaris: Secondary | ICD-10-CM | POA: Diagnosis not present

## 2017-05-18 DIAGNOSIS — L400 Psoriasis vulgaris: Secondary | ICD-10-CM | POA: Diagnosis not present

## 2017-05-21 ENCOUNTER — Ambulatory Visit (INDEPENDENT_AMBULATORY_CARE_PROVIDER_SITE_OTHER): Payer: Medicare Other | Admitting: Gastroenterology

## 2017-05-21 ENCOUNTER — Other Ambulatory Visit: Payer: Medicare Other

## 2017-05-21 ENCOUNTER — Ambulatory Visit: Payer: Medicare Other | Admitting: Gastroenterology

## 2017-05-21 ENCOUNTER — Encounter: Payer: Self-pay | Admitting: Gastroenterology

## 2017-05-21 VITALS — BP 142/66 | HR 62 | Ht 70.5 in | Wt 239.0 lb

## 2017-05-21 DIAGNOSIS — Z139 Encounter for screening, unspecified: Secondary | ICD-10-CM | POA: Diagnosis not present

## 2017-05-21 DIAGNOSIS — K5010 Crohn's disease of large intestine without complications: Secondary | ICD-10-CM | POA: Diagnosis not present

## 2017-05-21 DIAGNOSIS — K8301 Primary sclerosing cholangitis: Secondary | ICD-10-CM

## 2017-05-21 NOTE — Patient Instructions (Signed)
If you are age 70 or older, your body mass index should be between 23-30. Your Body mass index is 33.81 kg/m. If this is out of the aforementioned range listed, please consider follow up with your Primary Care Provider.  If you are age 56 or younger, your body mass index should be between 19-25. Your Body mass index is 33.81 kg/m. If this is out of the aformentioned range listed, please consider follow up with your Primary Care Provider.    Please go to the lab in the basement of our building to have lab work done as you leave today:  [id number]  You will be due for a recall OV in November 2019. You will reach out to you when it is time to schedule.  Continue Lialda.  Thank you for entrusting me with your care and for choosing [facility], Dr. [place] Cellar

## 2017-05-21 NOTE — Progress Notes (Signed)
HPI :  Crohn's / [place] history: Diagnosed with Crohns colitis in 1999 per Dr. Olevia Perches (thought Crohns due to appearance and ? Rectal sparing in the past?), also with history of PSC. He thinks he was diagnosed with [place] around 12 years ago or so. He had been followed at [facility] for [facility], Dr. Damita Lack.  It was recommended that he have a colonoscopy and MRI of his liver every year or so, which he declined to do.   Regarding his colitis history, was initially on Asacol, and then switched to Lialda. He has never been on Remicade or Humira, or other anti-TNFs. He reports a history of a colitis flare in 1999, his last hospitalization. 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   Last colonoscopy: 11/18/2013 - multiple large pseudopolyps, no active inflammation, no dysplasia   SINCE LAST VISIT  [place] He had an MRI of his abdomen 03/30/2017 - sequela of PSC, no focal hepatic lesion. Evidence of hepatic fibrotic changes, normal spleen, gallstones noted but no polyps of the gallbladder reported. LAEs have risen slightly compared to previous baseline as below. Last CA 19-9 level of 13. He states overall he is feeling fairly well. He denies any abdominal pains. He is not having any diarrhea or blood in the stools. He is compliant with to Lialda tablets per day. He is eating well, no weight loss. He does have some arthritis in his hands at times for which she is using a topical cream which helps.  He reported a flu shot this year. Is pneumococcal vaccines are up-to-date. Vaccinated to hepatitis A and B. He's taking calcium with vitamin D. Last DEXA scan in 2017.    Past Medical History:  Diagnosis Date  . Abnormal LFTs   . Benign prostatic hypertrophy   . Cholelithiasis    asymptomatic  . Crohn  disease ([place]   . Hemolytic anemia ([place]   . Hyperglycemia 05/03/2015  . Hypertension   . Kidney stones    Kidney stones  . Psoriasis   . Sclerosing cholangitis      Past Surgical History:  Procedure Laterality Date  . CYST REMOVAL WITH BONE GRAFT Right   . Dental Implant    . HAMMER TOE SURGERY    . HAND SURGERY     cyst removal- left hand secondary to broken finger  . LUMBAR LAMINECTOMY    . STAPEDECTOMY     Family History  Problem Relation Age of Onset  . Thyroid cancer Mother   . Lung cancer Father   . Breast cancer Unknown        aunt  . Lung cancer Unknown        aunt  . Clotting disorder Unknown        uncle  . Diabetes Brother   . Colon cancer Neg Hx    Social History   Tobacco Use  . Smoking status: Former Smoker    Last attempt to quit: 01/06/1997    Years since quitting: 20.3  . Smokeless tobacco: Never Used  Substance Use Topics  . Alcohol use: Yes    Alcohol/week: 21.0 oz    Types: 35 Standard drinks or equivalent per week    Comment: occassional  . Drug use: No   Current Outpatient Medications  Medication  Sig Dispense Refill  . Ascorbic Acid (VITAMIN C PO) Take 1 capsule by mouth daily.      Marland Kitchen aspirin 81 MG EC tablet TAKE 1 TABLET (81 MG TOTAL) BY MOUTH DAILY. 90 tablet 11  . b complex vitamins tablet Take 1 tablet by mouth daily.      . Calcium Carbonate-Vitamin D (CALCIUM + D PO) Take 1 capsule by mouth 2 (two) times daily.      . diclofenac sodium (VOLTAREN) 1 % GEL Apply 4 g topically 4 (four) times daily as needed. 400 g 5  . Iodoquinol-HC (HYDROCORTISONE-IODOQUINOL) 1-1 % CREA   3  . irbesartan (AVAPRO) 300 MG tablet Take 1 tablet (300 mg total) by mouth daily. 90 tablet 3  . mesalamine (LIALDA) 1.2 g EC tablet TAKE 2 TABLETS BY MOUTH DAILY WITH BREAKFAST 180 tablet 0  . Multiple Vitamins-Minerals (CENTRUM PO) Take 1 tablet by mouth daily.      . Omega-3 Fatty Acids (FISH OIL CONCENTRATE PO) Take 1 capsule by mouth daily.      Marland Kitchen  triamcinolone cream (KENALOG) 0.1 % APPLY ON THE SKIN TWICE A DAY  2  . VITAMIN A PO Take 1 capsule by mouth daily.      No current facility-administered medications for this visit.    Allergies  Allergen Reactions  . Penicillins Hives    REACTION: hives  . Typhoid Vaccines Rash and Other (See Comments)    "fever"     Review of Systems: All systems reviewed and negative except where noted in HPI.   Lab Results  Component Value Date   WBC 6.6 04/07/2017   HGB 14.8 04/07/2017   HCT 43.0 04/07/2017   MCV 87.3 04/07/2017   PLT 362.0 04/07/2017    Lab Results  Component Value Date   ALT 91 (H) 04/07/2017   AST 63 (H) 04/07/2017   ALKPHOS 314 (H) 04/07/2017   BILITOT 1.0 04/07/2017    Lab Results  Component Value Date   CREATININE 0.71 04/07/2017   BUN 10 04/07/2017   NA 136 04/07/2017   K 4.4 04/07/2017   CL 100 04/07/2017   CO2 30 04/07/2017      Physical Exam: BP (!) 142/66   Pulse 62   Ht 5' 10.5" (1.791 m)   Wt 239 lb (108.4 kg)   BMI 33.81 kg/m  Constitutional: Pleasant,well-developed, male in no acute distress. HEENT: Normocephalic and atraumatic. Conjunctivae are normal. No scleral icterus. Neck supple.  Cardiovascular: Normal rate, regular rhythm.  Pulmonary/chest: Effort normal and breath sounds normal. No wheezing, rales or rhonchi. Abdominal: Soft, nondistended, nontender. Bowel sounds active throughout. There are no masses palpable.  Extremities: no edema Lymphadenopathy: No cervical adenopathy noted. Neurological: Alert and oriented to person place and time. Skin: Skin is warm and dry. No rashes noted. Psychiatric: Normal mood and affect. Behavior is normal.   ASSESSMENT AND PLAN: 72 year old male here for reassessment of the following issues:  PSC - Followed by Dr. Damita Lack at [facility]. He recently had an MRI which appears to show stable changes without any mass lesions or abnormalities concerning for cholangiocarcinoma. He is due for CA-19-9  level also will refer him for that. He will continue see hepatology on an annual basis. May repeat DEXA scan at some point this year. He'll continue calcium and vitamin D. Vitamin D level was normal. Vaccines up-to-date.  Chron's colitis - on Lialda monotherapy and doing pretty well. He has no anemia. He has no bowel symptoms or pain  that bothers him. I have counseled him several times in the past that given his underlying PSC, long-standing Crohn's colitis with pseudopolyposis, he is at higher risk for colon cancer. Current guidelines recommend colonoscopy every year, and this is a strong recommendation. He again verbalizes understanding of the recommendation, he understands that he is higher than average risk for colon cancer, he continues to decline colonoscopy now. He is willing to do it in 2020. Lialda refilled.   He can follow up in 6 months or sooner with changes in his status.  Lago Cellar, MD [facility]

## 2017-05-22 DIAGNOSIS — L400 Psoriasis vulgaris: Secondary | ICD-10-CM | POA: Diagnosis not present

## 2017-05-22 LAB — CANCER ANTIGEN 19-9: CA 19-9: 23 U/mL (ref <34)

## 2017-05-25 DIAGNOSIS — L400 Psoriasis vulgaris: Secondary | ICD-10-CM | POA: Diagnosis not present

## 2017-05-28 DIAGNOSIS — L400 Psoriasis vulgaris: Secondary | ICD-10-CM | POA: Diagnosis not present

## 2017-06-02 DIAGNOSIS — L400 Psoriasis vulgaris: Secondary | ICD-10-CM | POA: Diagnosis not present

## 2017-06-04 DIAGNOSIS — L400 Psoriasis vulgaris: Secondary | ICD-10-CM | POA: Diagnosis not present

## 2017-06-08 DIAGNOSIS — L400 Psoriasis vulgaris: Secondary | ICD-10-CM | POA: Diagnosis not present

## 2017-06-11 DIAGNOSIS — L400 Psoriasis vulgaris: Secondary | ICD-10-CM | POA: Diagnosis not present

## 2017-06-15 DIAGNOSIS — L400 Psoriasis vulgaris: Secondary | ICD-10-CM | POA: Diagnosis not present

## 2017-06-18 DIAGNOSIS — L400 Psoriasis vulgaris: Secondary | ICD-10-CM | POA: Diagnosis not present

## 2017-06-22 DIAGNOSIS — L400 Psoriasis vulgaris: Secondary | ICD-10-CM | POA: Diagnosis not present

## 2017-06-25 DIAGNOSIS — L400 Psoriasis vulgaris: Secondary | ICD-10-CM | POA: Diagnosis not present

## 2017-07-02 DIAGNOSIS — L400 Psoriasis vulgaris: Secondary | ICD-10-CM | POA: Diagnosis not present

## 2017-07-06 DIAGNOSIS — L400 Psoriasis vulgaris: Secondary | ICD-10-CM | POA: Diagnosis not present

## 2017-07-10 DIAGNOSIS — L400 Psoriasis vulgaris: Secondary | ICD-10-CM | POA: Diagnosis not present

## 2017-07-13 DIAGNOSIS — L400 Psoriasis vulgaris: Secondary | ICD-10-CM | POA: Diagnosis not present

## 2017-07-16 DIAGNOSIS — L400 Psoriasis vulgaris: Secondary | ICD-10-CM | POA: Diagnosis not present

## 2017-07-20 DIAGNOSIS — L400 Psoriasis vulgaris: Secondary | ICD-10-CM | POA: Diagnosis not present

## 2017-07-23 DIAGNOSIS — L400 Psoriasis vulgaris: Secondary | ICD-10-CM | POA: Diagnosis not present

## 2017-07-27 DIAGNOSIS — L400 Psoriasis vulgaris: Secondary | ICD-10-CM | POA: Diagnosis not present

## 2017-07-30 DIAGNOSIS — L400 Psoriasis vulgaris: Secondary | ICD-10-CM | POA: Diagnosis not present

## 2017-08-03 DIAGNOSIS — L400 Psoriasis vulgaris: Secondary | ICD-10-CM | POA: Diagnosis not present

## 2017-08-06 ENCOUNTER — Other Ambulatory Visit: Payer: Self-pay | Admitting: Gastroenterology

## 2017-08-06 DIAGNOSIS — L400 Psoriasis vulgaris: Secondary | ICD-10-CM | POA: Diagnosis not present

## 2017-08-06 DIAGNOSIS — K50119 Crohn's disease of large intestine with unspecified complications: Secondary | ICD-10-CM

## 2017-08-10 DIAGNOSIS — L400 Psoriasis vulgaris: Secondary | ICD-10-CM | POA: Diagnosis not present

## 2017-08-13 DIAGNOSIS — L400 Psoriasis vulgaris: Secondary | ICD-10-CM | POA: Diagnosis not present

## 2017-08-17 DIAGNOSIS — L400 Psoriasis vulgaris: Secondary | ICD-10-CM | POA: Diagnosis not present

## 2017-08-20 DIAGNOSIS — L400 Psoriasis vulgaris: Secondary | ICD-10-CM | POA: Diagnosis not present

## 2017-08-24 DIAGNOSIS — L400 Psoriasis vulgaris: Secondary | ICD-10-CM | POA: Diagnosis not present

## 2017-08-27 DIAGNOSIS — L400 Psoriasis vulgaris: Secondary | ICD-10-CM | POA: Diagnosis not present

## 2017-09-01 DIAGNOSIS — L400 Psoriasis vulgaris: Secondary | ICD-10-CM | POA: Diagnosis not present

## 2017-09-03 DIAGNOSIS — L400 Psoriasis vulgaris: Secondary | ICD-10-CM | POA: Diagnosis not present

## 2017-09-08 DIAGNOSIS — L400 Psoriasis vulgaris: Secondary | ICD-10-CM | POA: Diagnosis not present

## 2017-09-10 DIAGNOSIS — L400 Psoriasis vulgaris: Secondary | ICD-10-CM | POA: Diagnosis not present

## 2017-09-14 DIAGNOSIS — L400 Psoriasis vulgaris: Secondary | ICD-10-CM | POA: Diagnosis not present

## 2017-09-17 DIAGNOSIS — L400 Psoriasis vulgaris: Secondary | ICD-10-CM | POA: Diagnosis not present

## 2017-09-21 DIAGNOSIS — L400 Psoriasis vulgaris: Secondary | ICD-10-CM | POA: Diagnosis not present

## 2017-09-22 ENCOUNTER — Telehealth: Payer: Self-pay | Admitting: Internal Medicine

## 2017-09-22 ENCOUNTER — Encounter: Payer: Self-pay | Admitting: Internal Medicine

## 2017-09-22 MED ORDER — TELMISARTAN 80 MG PO TABS: 80.0000 mg | ORAL_TABLET | Freq: Every day | ORAL | 3 refills | Status: DC

## 2017-09-22 NOTE — Telephone Encounter (Signed)
[place] for change to micardis 80 mg per day  But ask pt to have pharmacy call with name of another similar medication that is covered if this one is not covered

## 2017-09-22 NOTE — Telephone Encounter (Signed)
Copied from [place] [id number]. Topic: General - Other >> Sep 22, 2017  3:37 PM Mark [name] wrote: Reason for CRM: Patient wants to know if there is another brand or medication like, irbesartan (AVAPRO) 300 Mg. He says his pharmacy is currently out of stock for this medication and they don't know when they will have it in stock again. Also because of his insurance he can only go to the pharmacy on file.    Please advise

## 2017-09-23 NOTE — Telephone Encounter (Signed)
Called pt, LVM.   

## 2017-09-23 NOTE — Telephone Encounter (Signed)
Pt returning call  about his medicine please call at [phone]

## 2017-09-23 NOTE — Telephone Encounter (Signed)
Called pt back on number provided below. No answer or VM set up to leave a msg.

## 2017-09-24 DIAGNOSIS — L400 Psoriasis vulgaris: Secondary | ICD-10-CM | POA: Diagnosis not present

## 2017-09-24 NOTE — Telephone Encounter (Signed)
Pt returning call to [place]. [phone]

## 2017-09-24 NOTE — Telephone Encounter (Signed)
Returned call, once again no answer and no VM to leave a msg.

## 2017-09-28 DIAGNOSIS — L400 Psoriasis vulgaris: Secondary | ICD-10-CM | POA: Diagnosis not present

## 2017-10-01 DIAGNOSIS — L400 Psoriasis vulgaris: Secondary | ICD-10-CM | POA: Diagnosis not present

## 2017-10-05 DIAGNOSIS — L400 Psoriasis vulgaris: Secondary | ICD-10-CM | POA: Diagnosis not present

## 2017-10-08 DIAGNOSIS — L400 Psoriasis vulgaris: Secondary | ICD-10-CM | POA: Diagnosis not present

## 2017-10-12 DIAGNOSIS — L400 Psoriasis vulgaris: Secondary | ICD-10-CM | POA: Diagnosis not present

## 2017-10-12 NOTE — Progress Notes (Addendum)
Subjective:   Mark [name] is a 72 y.o. male who presents for Medicare Annual/Subsequent preventive examination.  Review of Systems:  No ROS.  Medicare Wellness Visit. Additional risk factors are reflected in the social history.  Cardiac Risk Factors include: advanced age (>20mn, >>52women);dyslipidemia;male gender;hypertension Sleep patterns: feels rested on waking, gets up 1-2 times nightly to void and sleeps 7-8 hours nightly.    Home Safety/Smoke Alarms: Feels safe in home. Smoke alarms in place.  Living environment; residence and Firearm Safety: 1-story house/ trailer, no firearms. Lives with brother, no needs for DME, good support system Seat Belt Safety/Bike Helmet: Wears seat belt.   PSA-  Lab Results  Component Value Date   PSA 1.62 04/07/2017   PSA 1.75 05/22/2016   PSA 2.01 05/02/2015       Objective:    Vitals: BP 132/74   Pulse 61   Resp 17   Ht 5' 11"  (1.803 m)   Wt 238 lb (108 kg)   SpO2 97%   BMI 33.19 kg/m   Body mass index is 33.19 kg/m.  Advanced Directives 10/13/2017 03/13/2016 10/31/2013  Does Patient Have a Medical Advance Directive? No Yes No  Type of Advance Directive - [place] will -  Does patient want to make changes to medical advance directive? Yes (ED - Information included in AVS) - -  Copy of Healthcare Power of Attorney in Chart? - No - copy requested -    Tobacco Social History   Tobacco Use  Smoking Status Former Smoker  . Last attempt to quit: 01/06/1997  . Years since quitting: 20.7  Smokeless Tobacco Never Used     Counseling given: Not Answered  Past Medical History:  Diagnosis Date  . Abnormal LFTs   . Benign prostatic hypertrophy   . Cholelithiasis    asymptomatic  . Crohn disease ([place]   . Hemolytic anemia ([place]   . Hyperglycemia 05/03/2015  . Hypertension   . Kidney stones    Kidney stones  . Psoriasis   . Sclerosing cholangitis    Past Surgical History:  Procedure Laterality  Date  . CYST REMOVAL WITH BONE GRAFT Right   . Dental Implant    . HAMMER TOE SURGERY    . HAND SURGERY     cyst removal- left hand secondary to broken finger  . LUMBAR LAMINECTOMY    . STAPEDECTOMY     Family History  Problem Relation Age of Onset  . Thyroid cancer Mother   . Lung cancer Father   . Breast cancer Unknown        aunt  . Lung cancer Unknown        aunt  . Clotting disorder Unknown        uncle  . Diabetes Brother   . Colon cancer Neg Hx    Social History   Socioeconomic History  . Marital status: Divorced    Spouse name: Not on file  . Number of children: 1  . Years of education: Not on file  . Highest education level: Not on file  Occupational History  . 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 Financial resource strain: Not hard at all  . Food insecurity:    Worry: Never true    Inability: Never true  . Transportation needs:    Medical: No    Non-medical: No  Tobacco Use  . Smoking status: Former Smoker    Last attempt to  quit: 01/06/1997    Years since quitting: 20.7  . Smokeless tobacco: Never Used  Substance and Sexual Activity  . Alcohol use: Yes    Alcohol/week: 35.0 standard drinks    Types: 35 Standard drinks or equivalent per week    Comment: occassional  . Drug use: No  . Sexual activity: Not Currently  Lifestyle  . Physical activity:    Days per week: 0 days    Minutes per session: 0 min  . Stress: Not at all  Relationships  . Social connections:    Talks on phone: More than three times a week    Gets together: More than three times a week    Attends religious service: Not on file    Active member of club or organization: Not on file    Attends meetings of clubs or organizations: Not on file    Relationship status: Divorced  Other Topics Concern  . Not on file  Social History Narrative  . Not on file    Outpatient Encounter Medications as of 10/13/2017  Medication Sig  . Ascorbic Acid (VITAMIN C PO)  Take 1 capsule by mouth daily.    Marland Kitchen aspirin 81 MG EC tablet TAKE 1 TABLET (81 MG TOTAL) BY MOUTH DAILY.  Marland Kitchen b complex vitamins tablet Take 1 tablet by mouth daily.    . Calcium Carbonate-Vitamin D (CALCIUM + D PO) Take 1 capsule by mouth 2 (two) times daily.    . diclofenac sodium (VOLTAREN) 1 % GEL Apply 4 g topically 4 (four) times daily as needed.  . Iodoquinol-HC (HYDROCORTISONE-IODOQUINOL) 1-1 % CREA   . mesalamine (LIALDA) 1.2 g EC tablet TAKE 2 TABLETS BY MOUTH DAILY WITH BREAKFAST  . Multiple Vitamins-Minerals (CENTRUM PO) Take 1 tablet by mouth daily.    . Omega-3 Fatty Acids (FISH OIL CONCENTRATE PO) Take 1 capsule by mouth daily.    Marland Kitchen telmisartan (MICARDIS) 80 MG tablet Take 1 tablet (80 mg total) by mouth daily.  Marland Kitchen triamcinolone cream (KENALOG) 0.1 % APPLY ON THE SKIN TWICE A DAY  . VITAMIN A PO Take 1 capsule by mouth daily.    No facility-administered encounter medications on file as of 10/13/2017.     Activities of Daily Living In your present state of health, do you have any difficulty performing the following activities: 10/13/2017  Hearing? N  Vision? N  Difficulty concentrating or making decisions? N  Walking or climbing stairs? N  Dressing or bathing? N  Doing errands, shopping? N  Preparing Food and eating ? N  Using the Toilet? N  In the past six months, have you accidently leaked urine? N  Do you have problems with loss of bowel control? N  Managing your Medications? N  Managing your Finances? N  Housekeeping or managing your Housekeeping? N  Some recent data might be hidden    Patient Care Team: Biagio Borg, MD as PCP - General (Internal Medicine) Jarome Matin, MD as Consulting Physician (Dermatology) Armbruster, Carlota Raspberry, MD as Consulting Physician (Gastroenterology)   Assessment:   This is a routine wellness examination for Mark [name]. Physical assessment deferred to PCP.   Exercise Activities and Dietary recommendations Current Exercise Habits: The  patient does not participate in regular exercise at present, Exercise limited by: None identified  Diet (meal preparation, eat out, water intake, caffeinated beverages, dairy products, fruits and vegetables): in general, a "healthy" diet  , well balanced   Reviewed heart healthy diet. Encouraged patient to increase daily water  and healthy fluid intake. Discussed weight loss tips. Relevant patient education assigned to patient using Emmi.  Goals    . maintain my current health status     Start to think about eating healthier and increasing daily water intake.    . Patient Stated     Maintain current health status. Go surf fishing when I can.       Fall Risk Fall Risk  10/13/2017 04/07/2017 05/22/2016 03/13/2016 05/02/2015  Falls in the past year? No No No No No   Depression Screen PHQ 2/9 Scores 10/13/2017 04/07/2017 05/22/2016 03/13/2016  PHQ - 2 Score 0 0 0 0    Cognitive Function       Ad8 score reviewed for issues:  Issues making decisions: no  Less interest in hobbies / activities: no  Repeats questions, stories (family complaining): no  Trouble using ordinary gadgets (microwave, computer, phone):no  Forgets the month or year: no  Mismanaging finances: no  Remembering appts: no  Daily problems with thinking and/or memory: no Ad8 score is= 0  Immunization History  Administered Date(s) Administered  . Hep A / Hep B 07/17/2014, 08/18/2014, 01/18/2015  . Influenza Split 11/13/2010  . Influenza Whole 11/06/2005, 10/28/2007, 10/19/2008, 11/13/2009  . Influenza, High Dose Seasonal PF 11/03/2012, 11/10/2013, 11/10/2013, 10/14/2016  . Influenza,inj,Quad PF,6+ Mos 10/11/2015  . Influenza-Unspecified 09/07/2014  . Pneumococcal Conjugate-13 04/15/2013  . Pneumococcal Polysaccharide-23 11/06/2005, 06/27/2014  . Td 01/31/2010  . Zoster 07/14/2007  . Zoster Recombinat (Shingrix) 04/08/2017, 07/24/2017   Screening Tests Health Maintenance  Topic Date Due  . COLONOSCOPY   11/18/2016  . INFLUENZA VACCINE  04/16/2018 (Originally 08/06/2017)  . TETANUS/TDAP  02/01/2020  . Hepatitis C Screening  Completed  . PNA vac Low Risk Adult  Completed      Plan:    Patient states he will make an appointment with Dr. Havery Moros to discuss colonoscopy.   Continue doing brain stimulating activities (puzzles, reading, adult coloring books, staying active) to keep memory sharp.   Continue to eat heart healthy diet (full of fruits, vegetables, whole grains, lean protein, water--limit salt, fat, and sugar intake) and increase physical activity as tolerated.  I have personally reviewed and noted the following in the patient's chart:   . Medical and social history . Use of alcohol, tobacco or illicit drugs  . Current medications and supplements . Functional ability and status . Nutritional status . Physical activity . Advanced directives . List of other physicians . Vitals . Screenings to include cognitive, depression, and falls . Referrals and appointments  In addition, I have reviewed and discussed with patient certain preventive protocols, quality metrics, and best practice recommendations. A written personalized care plan for preventive services as well as general preventive health recommendations were provided to patient.     Michiel Cowboy, RN  10/13/2017  Medical screening examination/treatment/procedure(s) were performed by non-physician practitioner and as supervising physician I was immediately available for consultation/collaboration. I agree with above. Cathlean Cower, MD

## 2017-10-13 ENCOUNTER — Ambulatory Visit (INDEPENDENT_AMBULATORY_CARE_PROVIDER_SITE_OTHER): Payer: Medicare Other | Admitting: *Deleted

## 2017-10-13 VITALS — BP 132/74 | HR 61 | Resp 17 | Ht 71.0 in | Wt 238.0 lb

## 2017-10-13 DIAGNOSIS — Z0000 Encounter for general adult medical examination without abnormal findings: Secondary | ICD-10-CM | POA: Diagnosis not present

## 2017-10-13 NOTE — Patient Instructions (Addendum)
Continue doing brain stimulating activities (puzzles, reading, adult coloring books, staying active) to keep memory sharp.   Continue to eat heart healthy diet (full of fruits, vegetables, whole grains, lean protein, water--limit salt, fat, and sugar intake) and increase physical activity as tolerated.   Mark [name] , Thank you for taking time to come for your Medicare Wellness Visit. I appreciate your ongoing commitment to your health goals. Please review the following plan we discussed and let me know if I can assist you in the future.   These are the goals we discussed: Goals    . maintain my current health status     Start to think about eating healthier and increasing daily water intake.    . Patient Stated     Maintain current health status. Go surf fishing when I can.       This is a list of the screening recommended for you and due dates:  Health Maintenance  Topic Date Due  . Colon Cancer Screening  11/18/2016  . Flu Shot  04/16/2018*  . Tetanus Vaccine  02/01/2020  .  Hepatitis C: One time screening is recommended by Center for Disease Control  (CDC) for  adults born from [birth year] through 1963.   Completed  . Pneumonia vaccines  Completed  *Topic was postponed. The date shown is not the original due date.     Health Maintenance, Male A healthy lifestyle and preventive care is important for your health and wellness. Ask your health care provider about what schedule of regular examinations is right for you. What should I know about weight and diet? Eat a Healthy Diet  Eat plenty of vegetables, fruits, whole grains, low-fat dairy products, and lean protein.  Do not eat a lot of foods high in solid fats, added sugars, or salt.  Maintain a Healthy Weight Regular exercise can help you achieve or maintain a healthy weight. You should:  Do at least 150 minutes of exercise each week. The exercise should increase your heart rate and make you sweat (moderate-intensity  exercise).  Do strength-training exercises at least twice a week.  Watch Your Levels of Cholesterol and Blood Lipids  Have your blood tested for lipids and cholesterol every 5 years starting at 70 years of age. If you are at high risk for heart disease, you should start having your blood tested when you are 70 years old. You may need to have your cholesterol levels checked more often if: ? Your lipid or cholesterol levels are high. ? You are older than 70 years of age. ? You are at high risk for heart disease.  What should I know about cancer screening? Many types of cancers can be detected early and may often be prevented. Lung Cancer  You should be screened every year for lung cancer if: ? You are a current smoker who has smoked for at least 30 years. ? You are a former smoker who has quit within the past 15 years.  Talk to your health care provider about your screening options, when you should start screening, and how often you should be screened.  Colorectal Cancer  Routine colorectal cancer screening usually begins at 70 years of age and should be repeated every 5-10 years until you are 70 years old. You may need to be screened more often if early forms of precancerous polyps or small growths are found. Your health care provider may recommend screening at an earlier age if you have risk factors for colon cancer.  Your health care provider may recommend using home test kits to check for hidden blood in the stool.  A small camera at the end of a tube can be used to examine your colon (sigmoidoscopy or colonoscopy). This checks for the earliest forms of colorectal cancer.  Prostate and Testicular Cancer  Depending on your age and overall health, your health care provider may do certain tests to screen for prostate and testicular cancer.  Talk to your health care provider about any symptoms or concerns you have about testicular or prostate cancer.  Skin Cancer  Check your skin  from head to toe regularly.  Tell your health care provider about any new moles or changes in moles, especially if: ? There is a change in a mole's size, shape, or color. ? You have a mole that is larger than a pencil eraser.  Always use sunscreen. Apply sunscreen liberally and repeat throughout the day.  Protect yourself by wearing long sleeves, pants, a wide-brimmed hat, and sunglasses when outside.  What should I know about heart disease, diabetes, and high blood pressure?  If you are 55-52 years of age, have your blood pressure checked every 3-5 years. If you are 34 years of age or older, have your blood pressure checked every year. You should have your blood pressure measured twice-once when you are at a hospital or clinic, and once when you are not at a hospital or clinic. Record the average of the two measurements. To check your blood pressure when you are not at a hospital or clinic, you can use: ? An automated blood pressure machine at a pharmacy. ? A home blood pressure monitor.  Talk to your health care provider about your target blood pressure.  If you are between 31-8 years old, ask your health care provider if you should take aspirin to prevent heart disease.  Have regular diabetes screenings by checking your fasting blood sugar level. ? If you are at a normal weight and have a low risk for diabetes, have this test once every three years after the age of 67. ? If you are overweight and have a high risk for diabetes, consider being tested at a younger age or more often.  A one-time screening for abdominal aortic aneurysm (AAA) by ultrasound is recommended for men aged 17-75 years who are current or former smokers. What should I know about preventing infection? Hepatitis B If you have a higher risk for hepatitis B, you should be screened for this virus. Talk with your health care provider to find out if you are at risk for hepatitis B infection. Hepatitis C Blood testing is  recommended for:  Everyone born from [birth year] through 1963.  Anyone with known risk factors for hepatitis C.  Sexually Transmitted Diseases (STDs)  You should be screened each year for STDs including gonorrhea and chlamydia if: ? You are sexually active and are younger than 70 years of age. ? You are older than 70 years of age and your health care provider tells you that you are at risk for this type of infection. ? Your sexual activity has changed since you were last screened and you are at an increased risk for chlamydia or gonorrhea. Ask your health care provider if you are at risk.  Talk with your health care provider about whether you are at high risk of being infected with HIV. Your health care provider may recommend a prescription medicine to help prevent HIV infection.  What else can I do?  Schedule regular health, dental, and eye exams.  Stay current with your vaccines (immunizations).  Do not use any tobacco products, such as cigarettes, chewing tobacco, and e-cigarettes. If you need help quitting, ask your health care provider.  Limit alcohol intake to no more than 2 drinks per day. One drink equals 12 ounces of beer, 5 ounces of Juliana Boling, or 1 ounces of hard liquor.  Do not use street drugs.  Do not share needles.  Ask your health care provider for help if you need support or information about quitting drugs.  Tell your health care provider if you often feel depressed.  Tell your health care provider if you have ever been abused or do not feel safe at home. This information is not intended to replace advice given to you by your health care provider. Make sure you discuss any questions you have with your health care provider. Document Released: 06/21/2007 Document Revised: 08/22/2015 Document Reviewed: 09/26/2014 Elsevier Interactive Patient Education  [employer].

## 2017-10-15 DIAGNOSIS — R932 Abnormal findings on diagnostic imaging of liver and biliary tract: Secondary | ICD-10-CM | POA: Diagnosis not present

## 2017-10-15 DIAGNOSIS — Z23 Encounter for immunization: Secondary | ICD-10-CM | POA: Diagnosis not present

## 2017-10-15 DIAGNOSIS — K8301 Primary sclerosing cholangitis: Secondary | ICD-10-CM | POA: Diagnosis not present

## 2017-10-15 DIAGNOSIS — R978 Other abnormal tumor markers: Secondary | ICD-10-CM | POA: Diagnosis not present

## 2017-10-15 DIAGNOSIS — K5100 Ulcerative (chronic) pancolitis without complications: Secondary | ICD-10-CM | POA: Diagnosis not present

## 2017-10-15 DIAGNOSIS — J9811 Atelectasis: Secondary | ICD-10-CM | POA: Diagnosis not present

## 2017-10-15 DIAGNOSIS — K8020 Calculus of gallbladder without cholecystitis without obstruction: Secondary | ICD-10-CM | POA: Diagnosis not present

## 2017-10-15 DIAGNOSIS — Z87891 Personal history of nicotine dependence: Secondary | ICD-10-CM | POA: Diagnosis not present

## 2017-10-15 DIAGNOSIS — Z79899 Other long term (current) drug therapy: Secondary | ICD-10-CM | POA: Diagnosis not present

## 2017-10-15 DIAGNOSIS — K740 Hepatic fibrosis: Secondary | ICD-10-CM | POA: Diagnosis not present

## 2017-10-15 DIAGNOSIS — Z789 Other specified health status: Secondary | ICD-10-CM | POA: Diagnosis not present

## 2017-10-16 DIAGNOSIS — L400 Psoriasis vulgaris: Secondary | ICD-10-CM | POA: Diagnosis not present

## 2017-10-19 DIAGNOSIS — L400 Psoriasis vulgaris: Secondary | ICD-10-CM | POA: Diagnosis not present

## 2017-10-20 ENCOUNTER — Encounter: Payer: Self-pay | Admitting: Internal Medicine

## 2017-10-20 ENCOUNTER — Ambulatory Visit (INDEPENDENT_AMBULATORY_CARE_PROVIDER_SITE_OTHER): Payer: Medicare Other | Admitting: Internal Medicine

## 2017-10-20 VITALS — BP 122/84 | HR 68 | Temp 97.6°F | Ht 71.0 in | Wt 237.0 lb

## 2017-10-20 DIAGNOSIS — E785 Hyperlipidemia, unspecified: Secondary | ICD-10-CM | POA: Diagnosis not present

## 2017-10-20 DIAGNOSIS — R739 Hyperglycemia, unspecified: Secondary | ICD-10-CM

## 2017-10-20 DIAGNOSIS — I10 Essential (primary) hypertension: Secondary | ICD-10-CM

## 2017-10-20 NOTE — Patient Instructions (Signed)
Please continue all other medications as before, and refills have been done if requested.  Please have the pharmacy call with any other refills you may need.  Please continue your efforts at being more active, low cholesterol diet, and weight control  Please keep your appointments with your specialists as you may have planned  No further lab work is needed today  Please be sure to follow up with [facility] about your Oct 10 lab testing  Please return in 6 months, or sooner if needed

## 2017-10-20 NOTE — Assessment & Plan Note (Signed)
stable overall by history and exam, recent data reviewed with pt, and pt to continue medical treatment as before,  to f/u any worsening symptoms or concerns  

## 2017-10-20 NOTE — Progress Notes (Signed)
Subjective:    Patient ID: Mark [name], male    DOB: 23-Sep-1945, 72 y.o.   MRN: [id number]  HPI  Here to f/u; overall doing ok,  Pt denies chest pain, increasing sob or doe, wheezing, orthopnea, PND, increased LE swelling, palpitations, dizziness or syncope.  Pt denies new neurological symptoms such as new headache, or facial or extremity weakness or numbness.  Pt denies polydipsia, polyuria, or low sugar episode.  Pt states overall good compliance with meds, mostly trying to follow appropriate diet, with wt overall stable,  but little exercise however. Pt has appt with GI Dr Havery Moros in nov, pt wants colonscopy for fall 2020.  No new complaints Past Medical History:  Diagnosis Date  . Abnormal LFTs   . Benign prostatic hypertrophy   . Cholelithiasis    asymptomatic  . Crohn disease ([place])   . Hemolytic anemia ([place])   . Hyperglycemia 05/03/2015  . Hypertension   . Kidney stones    Kidney stones  . Psoriasis   . Sclerosing cholangitis    Past Surgical History:  Procedure Laterality Date  . CYST REMOVAL WITH BONE GRAFT Right   . Dental Implant    . HAMMER TOE SURGERY    . HAND SURGERY     cyst removal- left hand secondary to broken finger  . LUMBAR LAMINECTOMY    . STAPEDECTOMY      reports that he quit smoking about 20 years ago. He has never used smokeless tobacco. He reports that he drinks about 35.0 standard drinks of alcohol per week. He reports that he does not use drugs. family history includes Breast cancer in his unknown relative; Clotting disorder in his unknown relative; Diabetes in his brother; Lung cancer in his father and unknown relative; Thyroid cancer in his mother. Allergies  Allergen Reactions  . Penicillins Hives    REACTION: hives  . Typhoid Vaccines Rash and Other (See Comments)    "fever"   Current Outpatient Medications on File Prior to Visit  Medication Sig Dispense Refill  . Ascorbic Acid (VITAMIN C PO) Take 1 capsule by mouth daily.      Marland Kitchen  aspirin 81 MG EC tablet TAKE 1 TABLET (81 MG TOTAL) BY MOUTH DAILY. 90 tablet 11  . b complex vitamins tablet Take 1 tablet by mouth daily.      . Calcium Carbonate-Vitamin D (CALCIUM + D PO) Take 1 capsule by mouth 2 (two) times daily.      . diclofenac sodium (VOLTAREN) 1 % GEL Apply 4 g topically 4 (four) times daily as needed. 400 g 5  . Iodoquinol-HC (HYDROCORTISONE-IODOQUINOL) 1-1 % CREA   3  . mesalamine (LIALDA) 1.2 g EC tablet TAKE 2 TABLETS BY MOUTH DAILY WITH BREAKFAST 180 tablet 1  . Multiple Vitamins-Minerals (CENTRUM PO) Take 1 tablet by mouth daily.      Marland Kitchen telmisartan (MICARDIS) 80 MG tablet Take 1 tablet (80 mg total) by mouth daily. 90 tablet 3  . triamcinolone cream (KENALOG) 0.1 % APPLY ON THE SKIN TWICE A DAY  2  . VITAMIN A PO Take 1 capsule by mouth daily.      No current facility-administered medications on file prior to visit.    Review of Systems  Constitutional: Negative for other unusual diaphoresis or sweats HENT: Negative for ear discharge or swelling Eyes: Negative for other worsening visual disturbances Respiratory: Negative for stridor or other swelling  Gastrointestinal: Negative for worsening distension or other blood Genitourinary: Negative for retention  or other urinary change Musculoskeletal: Negative for other MSK pain or swelling Skin: Negative for color change or other new lesions Neurological: Negative for worsening tremors and other numbness  Psychiatric/Behavioral: Negative for worsening agitation or other fatigue All other system neg per pt    Objective:   Physical Exam BP 122/84   Pulse 68   Temp 97.6 F (36.4 C) (Oral)   Ht 5' 11"  (1.803 m)   Wt 237 lb (107.5 kg)   SpO2 96%   BMI 33.05 kg/m  VS noted,  Constitutional: Pt appears in NAD HENT: Head: NCAT.  Right Ear: External ear normal.  Left Ear: External ear normal.  Eyes: . Pupils are equal, round, and reactive to light. Conjunctivae and EOM are normal Nose: without d/c or  deformity Neck: Neck supple. Gross normal ROM Cardiovascular: Normal rate and regular rhythm.   Pulmonary/Chest: Effort normal and breath sounds without rales or wheezing.  Abd:  Soft, NT, ND, + BS, no organomegaly Neurological: Pt is alert. At baseline orientation, motor grossly intact Skin: Skin is warm. No rashes, other new lesions, no LE edema Psychiatric: Pt behavior is normal without agitation, not depressed affect No other exam findings Lab Results  Component Value Date   WBC 6.6 04/07/2017   HGB 14.8 04/07/2017   HCT 43.0 04/07/2017   PLT 362.0 04/07/2017   GLUCOSE 83 04/07/2017   CHOL 262 (H) 04/07/2017   TRIG 83.0 04/07/2017   HDL 79.70 04/07/2017   LDLCALC 166 (H) 04/07/2017   ALT 91 (H) 04/07/2017   AST 63 (H) 04/07/2017   NA 136 04/07/2017   K 4.4 04/07/2017   CL 100 04/07/2017   CREATININE 0.71 04/07/2017   BUN 10 04/07/2017   CO2 30 04/07/2017   TSH 1.14 04/07/2017   PSA 1.62 04/07/2017   HGBA1C 5.1 04/07/2017       Assessment & Plan:

## 2017-10-22 DIAGNOSIS — L400 Psoriasis vulgaris: Secondary | ICD-10-CM | POA: Diagnosis not present

## 2017-10-26 DIAGNOSIS — L400 Psoriasis vulgaris: Secondary | ICD-10-CM | POA: Diagnosis not present

## 2017-10-28 DIAGNOSIS — H52203 Unspecified astigmatism, bilateral: Secondary | ICD-10-CM | POA: Diagnosis not present

## 2017-10-28 DIAGNOSIS — H2513 Age-related nuclear cataract, bilateral: Secondary | ICD-10-CM | POA: Diagnosis not present

## 2017-10-28 DIAGNOSIS — H524 Presbyopia: Secondary | ICD-10-CM | POA: Diagnosis not present

## 2017-10-29 DIAGNOSIS — L400 Psoriasis vulgaris: Secondary | ICD-10-CM | POA: Diagnosis not present

## 2017-11-05 DIAGNOSIS — L400 Psoriasis vulgaris: Secondary | ICD-10-CM | POA: Diagnosis not present

## 2017-11-09 ENCOUNTER — Encounter: Payer: Self-pay | Admitting: Family

## 2017-11-09 ENCOUNTER — Ambulatory Visit (INDEPENDENT_AMBULATORY_CARE_PROVIDER_SITE_OTHER): Payer: Medicare Other | Admitting: Family

## 2017-11-09 ENCOUNTER — Other Ambulatory Visit (INDEPENDENT_AMBULATORY_CARE_PROVIDER_SITE_OTHER): Payer: Medicare Other

## 2017-11-09 VITALS — BP 144/80 | HR 94 | Temp 97.9°F | Ht 71.0 in | Wt 239.0 lb

## 2017-11-09 DIAGNOSIS — R58 Hemorrhage, not elsewhere classified: Secondary | ICD-10-CM

## 2017-11-09 DIAGNOSIS — L400 Psoriasis vulgaris: Secondary | ICD-10-CM | POA: Diagnosis not present

## 2017-11-09 LAB — CBC WITH DIFFERENTIAL/PLATELET
BASOS ABS: 0.2 10*3/uL — AB (ref 0.0–0.1)
Basophils Relative: 2.5 % (ref 0.0–3.0)
Eosinophils Absolute: 0.5 10*3/uL (ref 0.0–0.7)
Eosinophils Relative: 5.7 % — ABNORMAL HIGH (ref 0.0–5.0)
HEMATOCRIT: 40 % (ref 39.0–52.0)
Hemoglobin: 13.9 g/dL (ref 13.0–17.0)
LYMPHS ABS: 1.5 10*3/uL (ref 0.7–4.0)
LYMPHS PCT: 17.5 % (ref 12.0–46.0)
MCHC: 34.8 g/dL (ref 30.0–36.0)
MCV: 87.3 fl (ref 78.0–100.0)
Monocytes Absolute: 1.1 10*3/uL — ABNORMAL HIGH (ref 0.1–1.0)
Monocytes Relative: 12.6 % — ABNORMAL HIGH (ref 3.0–12.0)
NEUTROS ABS: 5.4 10*3/uL (ref 1.4–7.7)
NEUTROS PCT: 61.7 % (ref 43.0–77.0)
PLATELETS: 325 10*3/uL (ref 150.0–400.0)
RBC: 4.58 Mil/uL (ref 4.22–5.81)
RDW: 13.6 % (ref 11.5–15.5)
WBC: 8.8 10*3/uL (ref 4.0–10.5)

## 2017-11-09 NOTE — Progress Notes (Signed)
Mark [name] is a 72 y.o. male with the following history as recorded in [employer]:  Patient Active Problem List   Diagnosis Date Noted  . Debility 05/22/2016  . Cough 10/11/2015  . Hyperglycemia 05/03/2015  . Bilateral hearing loss 05/02/2015  . Right hand pain 10/04/2014  . Gout 10/04/2014  . Increased prostate specific antigen (PSA) velocity 05/17/2013  . Essential hypertension, benign 04/15/2013  . Hyperlipidemia 04/15/2013  . Bladder neck obstruction 04/15/2013  . Hypogonadism, male 07/28/2012  . PSORIASIS 07/07/2007  . Irritable bowel syndrome 01/29/2007  . HEMOLYTIC ANEMIA 01/18/2007  . PNEUMONIA 01/18/2007  . CROHN'S DISEASE 01/18/2007  . ULCERATIVE COLITIS 01/18/2007  . CHOLELITHIASIS, ASYMPTOMATIC 01/18/2007  . SCLEROSING CHOLANGITIS 01/18/2007  . Nasal bones, closed fracture 01/18/2007  . FRACTURE, CLAVICLE 01/18/2007  . NEPHROLITHIASIS, HX OF 01/18/2007  . BENIGN PROSTATIC HYPERTROPHY, HX OF 01/18/2007  . NEEDLE BIOPSY, LIVER, HX OF 01/18/2007  . LAMINECTOMY, LUMBAR, HX OF 01/18/2007    Current Outpatient Medications  Medication Sig Dispense Refill  . Ascorbic Acid (VITAMIN C PO) Take 1 capsule by mouth daily.      Marland Kitchen aspirin 81 MG EC tablet TAKE 1 TABLET (81 MG TOTAL) BY MOUTH DAILY. 90 tablet 11  . b complex vitamins tablet Take 1 tablet by mouth daily.      . Calcium Carbonate-Vitamin D (CALCIUM + D PO) Take 1 capsule by mouth 2 (two) times daily.      . diclofenac sodium (VOLTAREN) 1 % GEL Apply 4 g topically 4 (four) times daily as needed. 400 g 5  . Iodoquinol-HC (HYDROCORTISONE-IODOQUINOL) 1-1 % CREA   3  . mesalamine (LIALDA) 1.2 g EC tablet TAKE 2 TABLETS BY MOUTH DAILY WITH BREAKFAST 180 tablet 1  . Multiple Vitamins-Minerals (CENTRUM PO) Take 1 tablet by mouth daily.      Marland Kitchen telmisartan (MICARDIS) 80 MG tablet Take 1 tablet (80 mg total) by mouth daily. 90 tablet 3  . triamcinolone cream (KENALOG) 0.1 % APPLY ON THE SKIN TWICE A DAY  2  . VITAMIN A  PO Take 1 capsule by mouth daily.      No current facility-administered medications for this visit.     Allergies: Penicillins and Typhoid vaccines  Past Medical History:  Diagnosis Date  . Abnormal LFTs   . Benign prostatic hypertrophy   . Cholelithiasis    asymptomatic  . Crohn disease ([place])   . Hemolytic anemia ([place])   . Hyperglycemia 05/03/2015  . Hypertension   . Kidney stones    Kidney stones  . Psoriasis   . Sclerosing cholangitis     Past Surgical History:  Procedure Laterality Date  . CYST REMOVAL WITH BONE GRAFT Right   . Dental Implant    . HAMMER TOE SURGERY    . HAND SURGERY     cyst removal- left hand secondary to broken finger  . LUMBAR LAMINECTOMY    . STAPEDECTOMY      Family History  Problem Relation Age of Onset  . Thyroid cancer Mother   . Lung cancer Father   . Breast cancer Unknown        aunt  . Lung cancer Unknown        aunt  . Clotting disorder Unknown        uncle  . Diabetes Brother   . Colon cancer Neg Hx     Social History   Tobacco Use  . Smoking status: Former Smoker    Last attempt to  quit: 01/06/1997    Years since quitting: 20.8  . Smokeless tobacco: Never Used  Substance Use Topics  . Alcohol use: Yes    Alcohol/week: 35.0 standard drinks    Types: 35 Standard drinks or equivalent per week    Comment: occassional    Subjective:  Patient notes that he suddenly started bleeding this morning from vein on his upper inner left thigh; notes he did not remember injuring the leg/ does not think he hit the area; was on the toilet having a bowel movement when symptoms started; states that the area bled "profusely" for a number of minutes; was able to get it stopped with pressure and OTC Wound sealer; states he is here today to "get supplies" to help treat the bleeding if it re-starts; no pain at site; does take [place] daily and Lialda;     Objective:  Vitals:   11/09/17 1355  BP: (!) 144/80  Pulse: 94  Temp: 97.9 F (36.6 C)   TempSrc: Oral  SpO2: 96%  Weight: 239 lb 0.6 oz (108.4 kg)  Height: 5' 11"  (1.803 m)    General: Well developed, well nourished, in no acute distress  Skin : Warm and dry. Left inner thigh shows numerous varicose veins; no active bleeding seen; no warmth or redness noted on inner thigh Head: Normocephalic and atraumatic  Eyes: Sclera and conjunctiva clear; pupils round and reactive to light; extraocular movements intact  Ears: External normal; canals clear; tympanic membranes normal  Oropharynx: Pink, supple. No suspicious lesions  Neck: Supple without thyromegaly, adenopathy  Lungs: Respirations unlabored;  Neurologic: Alert and oriented; speech intact; face symmetrical; moves all extremities well; CNII-XII intact without focal deficit   Assessment:  1. Bleeding     Plan:  Reassurance; patient's skin is thin- ? If he strained with bowel movement and small tear occurred to vein; no active bleeding noted today; update CBC; encouraged to apply pressure/ elevate leg if bleeding recurs;   No follow-ups on file.  Orders Placed This Encounter  Procedures  . CBC w/Diff    Standing Status:   Future    Number of Occurrences:   1    Standing Expiration Date:   11/09/2018    Requested Prescriptions    No prescriptions requested or ordered in this encounter

## 2017-11-10 ENCOUNTER — Telehealth: Payer: Self-pay | Admitting: Internal Medicine

## 2017-11-10 DIAGNOSIS — I8390 Asymptomatic varicose veins of unspecified lower extremity: Secondary | ICD-10-CM

## 2017-11-10 NOTE — Telephone Encounter (Signed)
Copied from [place] [id number]. Topic: Referral - Request for Referral >> Nov 10, 2017 10:20 AM Yvette Rack wrote: Has patient seen PCP for this complaint? yes  *If NO, is insurance requiring patient see PCP for this issue before PCP can refer them? Referral for which specialty: Vein Specialist Preferred provider/office: No preferred provider / [facility] Reason for referral: Pt states he received a call and was told if he continues to have problems he will need to see a vein specialist

## 2017-11-10 NOTE — Telephone Encounter (Signed)
Ok referral done 

## 2017-11-11 DIAGNOSIS — R932 Abnormal findings on diagnostic imaging of liver and biliary tract: Secondary | ICD-10-CM | POA: Diagnosis not present

## 2017-11-11 DIAGNOSIS — K8301 Primary sclerosing cholangitis: Secondary | ICD-10-CM | POA: Diagnosis not present

## 2017-11-12 DIAGNOSIS — L400 Psoriasis vulgaris: Secondary | ICD-10-CM | POA: Diagnosis not present

## 2017-11-16 DIAGNOSIS — L400 Psoriasis vulgaris: Secondary | ICD-10-CM | POA: Diagnosis not present

## 2017-11-19 DIAGNOSIS — L400 Psoriasis vulgaris: Secondary | ICD-10-CM | POA: Diagnosis not present

## 2017-11-23 DIAGNOSIS — L400 Psoriasis vulgaris: Secondary | ICD-10-CM | POA: Diagnosis not present

## 2017-11-25 ENCOUNTER — Encounter: Payer: Self-pay | Admitting: Gastroenterology

## 2017-11-25 ENCOUNTER — Ambulatory Visit (INDEPENDENT_AMBULATORY_CARE_PROVIDER_SITE_OTHER): Payer: Medicare Other | Admitting: Gastroenterology

## 2017-11-25 VITALS — BP 130/82 | HR 76 | Ht 71.0 in | Wt 242.0 lb

## 2017-11-25 DIAGNOSIS — K50119 Crohn's disease of large intestine with unspecified complications: Secondary | ICD-10-CM | POA: Diagnosis not present

## 2017-11-25 DIAGNOSIS — K8301 Primary sclerosing cholangitis: Secondary | ICD-10-CM | POA: Diagnosis not present

## 2017-11-25 DIAGNOSIS — R978 Other abnormal tumor markers: Secondary | ICD-10-CM

## 2017-11-25 DIAGNOSIS — Z7289 Other problems related to lifestyle: Secondary | ICD-10-CM | POA: Diagnosis not present

## 2017-11-25 DIAGNOSIS — Z789 Other specified health status: Secondary | ICD-10-CM

## 2017-11-25 MED ORDER — MESALAMINE 1.2 G PO TBEC: 2.4000 g | DELAYED_RELEASE_TABLET | Freq: Every day | ORAL | 3 refills | Status: DC

## 2017-11-25 NOTE — Progress Notes (Signed)
HPI :  Crohn's / [place] history: Diagnosed with Crohns colitis in 1999 per Dr. Olevia Perches (thought Crohns due to appearance and ? Rectal sparing in the past?), also with history of PSC. He thinks he was diagnosed with [place] around 12years ago or so. He had been followed at [facility] for [facility], Dr. Damita Lack. It was recommended that he have a colonoscopy and MRI of his liver every year or so.   Regarding his colitis history, was initially on Asacol, and then switched to Lialda. He has never been on Remicade or Humira, or other anti-TNFs. He reports a history of a colitis flare in 1999, his last hospitalization. 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   Last colonoscopy: 11/18/2013 - multiple large pseudopolyps, no active inflammation, no dysplasia   SINCE LAST VISIT  72year old male here for follow-up visit for [place] Cro[place] colitis. He reports he is been compliant with Lialda since of last seen him. He has regular bowel habits. No diarrhea, noted constipation. He denies any blood in the stools. He denies any abdominal pains. He denies any weight loss. Generally he is feeling pretty well.  He [facility] Dr[facility]. His CA 19-9 level had risen to 173 which was concerning. He underwent an MRI/MRCP which per report showed changes c/w chronic PSC but no mass lesions or new abnormalities. The pancreas reportedly looked normal. He has not spoken with them about follow up plans yet. He does endorse drinking anywhere from 0-5 alcoholic beverages per day, on average about 3 drinks per day. He has been counseled to abstain from alcohol by his Hepatologist but he has continued to drink. His AP has risen from high 200s to high 300s and ALT from 50-70s to 120s. T bili remains normal.   We have recommended a  colonoscopy to him at each of his last clinic visits which he has declined. He reports he is willing to have a colonoscopy done in 2020 although does not want to schedule that today. His vaccinations are up to date.    Past Medical History:  Diagnosis Date  . Abnormal LFTs   . Benign prostatic hypertrophy   . Cholelithiasis    asymptomatic  . Cro[place] disease ([place]   . Hemolytic anemia ([place]   . Hyperglycemia 05/03/2015  . Hypertension   . Kidney stones    Kidney stones  . Psoriasis   . Sclerosing cholangitis      Past Surgical History:  Procedure Laterality Date  . CYST REMOVAL WITH BONE GRAFT Right   . Dental Implant    . HAMMER TOE SURGERY    . HAND SURGERY     cyst removal- left hand secondary to broken finger  . LUMBAR LAMINECTOMY    . STAPEDECTOMY     Family History  Problem Relation Age of Onset  . Thyroid cancer Mother   . Lung cancer Father   . Breast cancer Unknown        aunt  . Lung cancer Unknown        aunt  . Clotting disorder Unknown        uncle  . Diabetes Brother   . Bladder Cancer Brother   . Colon cancer Neg Hx    Social History   Tobacco Use  .  Smoking status: Former Smoker    Last attempt to quit: 01/06/1997    Years since quitting: 20.8  . Smokeless tobacco: Never Used  Substance Use Topics  . Alcohol use: Yes    Alcohol/week: 35.0 standard drinks    Types: 35 Standard drinks or equivalent per week    Comment: occassional  . Drug use: No   Current Outpatient Medications  Medication Sig Dispense Refill  . Ascorbic Acid (VITAMIN C PO) Take 1 capsule by mouth daily.      Marland Kitchen aspirin 81 MG EC tablet TAKE 1 TABLET (81 MG TOTAL) BY MOUTH DAILY. 90 tablet 11  . b complex vitamins tablet Take 1 tablet by mouth daily.      . Calcium Carbonate-Vitamin D (CALCIUM + D PO) Take 1 capsule by mouth 2 (two) times daily.      . Iodoquinol-HC (HYDROCORTISONE-IODOQUINOL) 1-1 % CREA   3  . mesalamine (LIALDA) 1.2 g EC tablet TAKE 2 TABLETS BY MOUTH DAILY  WITH BREAKFAST 180 tablet 1  . Multiple Vitamins-Minerals (CENTRUM PO) Take 1 tablet by mouth daily.      Marland Kitchen telmisartan (MICARDIS) 80 MG tablet Take 1 tablet (80 mg total) by mouth daily. 90 tablet 3  . triamcinolone cream (KENALOG) 0.1 % APPLY ON THE SKIN TWICE A DAY  2  . VITAMIN A PO Take 1 capsule by mouth daily.      No current facility-administered medications for this visit.    Allergies  Allergen Reactions  . Penicillins Hives    REACTION: hives  . Typhoid Vaccines Rash and Other (See Comments)    "fever"     Review of Systems: All systems reviewed and negative except where noted in HPI.   Lab Results  Component Value Date   WBC 8.8 11/09/2017   HGB 13.9 11/09/2017   HCT 40.0 11/09/2017   MCV 87.3 11/09/2017   PLT 325.0 11/09/2017    Lab Results  Component Value Date   CREATININE 0.71 04/07/2017   BUN 10 04/07/2017   NA 136 04/07/2017   K 4.4 04/07/2017   CL 100 04/07/2017   CO2 30 04/07/2017    Lab Results  Component Value Date   ALT 91 (H) 04/07/2017   AST 63 (H) 04/07/2017   ALKPHOS 314 (H) 04/07/2017   BILITOT 1.0 04/07/2017     Physical Exam: BP 130/82   Pulse 76   Ht 5' 11"  (1.803 m)   Wt 242 lb (109.8 kg)   BMI 33.75 kg/m  Constitutional: Pleasant,well-developed, male in no acute distress. HEENT: Normocephalic and atraumatic. Conjunctivae are normal. No scleral icterus. Neck supple.  Cardiovascular: Normal rate, regular rhythm.  Pulmonary/chest: Effort normal and breath sounds normal. No wheezing, rales or rhonchi. Abdominal: Soft, nondistended, nontender.. There are no masses palpable. No hepatomegaly. Extremities: no edema Lymphadenopathy: No cervical adenopathy noted. Neurological: Alert and oriented to person place and time. Skin: Skin is warm and dry. No rashes noted. Psychiatric: Normal mood and affect. Behavior is normal.   ASSESSMENT AND PLAN: 72 year old male here for reassessment of the following issues:  PSC / rising CA  19-9 / Crohn's colitis / alcohol use - rising CA 19-9 and LAEs is quite concerning however his recent MRI/MRCP at [facility] reportedly looks okay without interval changes. He has not yet heard back from Dr. Damita Lack (Hepatology) about that result, but recommend he call them for follow up recommendations regarding timing of repeat CA 19-9 and MRI as they have been managing that issue,  and if he would need an ERCP or not (intrahepatic ductal dilation seems chronic). We discussed ddx for elevated CA 19-9 and concern for underlying malignancy. While there is not any obvious lesion in his liver / biliary tree / pancreas on his MRI, colon cancer can rarely present with elevated CA 19-9 and discussed recommendation for a colonoscopy. He is willing to do it in 2020 but not sooner (last done in 2015 and has been recommended for him over the years to have it done but he has declined). He will contact me when he is ready to schedule, I recommended he do it as soon as possible. Otherwise will continue the Lialda. We otherwise discussed his alcohol use and give his underlying liver disease it is highly recommended he completely abstain. He verbalized understanding of my concerns about that. I asked him to contact me when he hears back from Dr. Damita Lack so we can order his CA 19-9 levels, can do that for him locally and perform imaging when he needs it. If he does not hear from him within 2 weeks I asked him to call me. He agreed  [place] Cellar, MD [facility]

## 2017-11-25 NOTE — Patient Instructions (Addendum)
If you are age 70 or older, your body mass index should be between 23-30. Your Body mass index is 33.75 kg/m. If this is out of the aforementioned range listed, please consider follow up with your Primary Care Provider.  If you are age 88 or younger, your body mass index should be between 19-25. Your Body mass index is 33.75 kg/m. If this is out of the aformentioned range listed, please consider follow up with your Primary Care Provider.    We have sent the following medications to your pharmacy for you to pick up at your convenience: Lialda 2.4g daily  Thank you for entrusting me with your care and for choosing [facility], Dr. Nocatee Cellar

## 2017-11-26 DIAGNOSIS — L400 Psoriasis vulgaris: Secondary | ICD-10-CM | POA: Diagnosis not present

## 2017-11-30 DIAGNOSIS — L400 Psoriasis vulgaris: Secondary | ICD-10-CM | POA: Diagnosis not present

## 2017-12-06 HISTORY — PX: ERCP: SHX60

## 2017-12-07 DIAGNOSIS — L400 Psoriasis vulgaris: Secondary | ICD-10-CM | POA: Diagnosis not present

## 2017-12-10 DIAGNOSIS — L400 Psoriasis vulgaris: Secondary | ICD-10-CM | POA: Diagnosis not present

## 2017-12-14 DIAGNOSIS — L400 Psoriasis vulgaris: Secondary | ICD-10-CM | POA: Diagnosis not present

## 2017-12-15 DIAGNOSIS — R932 Abnormal findings on diagnostic imaging of liver and biliary tract: Secondary | ICD-10-CM | POA: Diagnosis not present

## 2017-12-15 DIAGNOSIS — I10 Essential (primary) hypertension: Secondary | ICD-10-CM | POA: Diagnosis not present

## 2017-12-15 DIAGNOSIS — K831 Obstruction of bile duct: Secondary | ICD-10-CM | POA: Diagnosis not present

## 2017-12-15 DIAGNOSIS — K8020 Calculus of gallbladder without cholecystitis without obstruction: Secondary | ICD-10-CM | POA: Diagnosis not present

## 2017-12-15 DIAGNOSIS — K8301 Primary sclerosing cholangitis: Secondary | ICD-10-CM | POA: Diagnosis not present

## 2017-12-17 DIAGNOSIS — L400 Psoriasis vulgaris: Secondary | ICD-10-CM | POA: Diagnosis not present

## 2017-12-21 DIAGNOSIS — L400 Psoriasis vulgaris: Secondary | ICD-10-CM | POA: Diagnosis not present

## 2017-12-23 ENCOUNTER — Telehealth: Payer: Self-pay

## 2017-12-23 ENCOUNTER — Telehealth: Payer: Self-pay | Admitting: Gastroenterology

## 2017-12-23 ENCOUNTER — Other Ambulatory Visit (INDEPENDENT_AMBULATORY_CARE_PROVIDER_SITE_OTHER): Payer: Medicare Other

## 2017-12-23 ENCOUNTER — Other Ambulatory Visit: Payer: Self-pay

## 2017-12-23 DIAGNOSIS — K50919 Crohn's disease, unspecified, with unspecified complications: Secondary | ICD-10-CM

## 2017-12-23 LAB — HEPATIC FUNCTION PANEL
ALT: 88 U/L — ABNORMAL HIGH (ref 0–53)
AST: 66 U/L — ABNORMAL HIGH (ref 0–37)
Albumin: 3.5 g/dL (ref 3.5–5.2)
Alkaline Phosphatase: 456 U/L — ABNORMAL HIGH (ref 39–117)
BILIRUBIN DIRECT: 0.5 mg/dL — AB (ref 0.0–0.3)
BILIRUBIN TOTAL: 1.1 mg/dL (ref 0.2–1.2)
Total Protein: 7.7 g/dL (ref 6.0–8.3)

## 2017-12-23 NOTE — Telephone Encounter (Signed)
Patient came to lab for lfts.  He stated that [facility] had requested he come to our office to have it drawn locally.  [facility] faxed this request to Dr. Havery Moros.  I put the order in and patient had lab drawn.

## 2017-12-24 ENCOUNTER — Telehealth: Payer: Self-pay | Admitting: Gastroenterology

## 2017-12-24 DIAGNOSIS — L400 Psoriasis vulgaris: Secondary | ICD-10-CM | POA: Diagnosis not present

## 2017-12-24 NOTE — Telephone Encounter (Signed)
Pt stated that he was returning your call.

## 2017-12-28 ENCOUNTER — Telehealth: Payer: Self-pay | Admitting: Gastroenterology

## 2017-12-28 DIAGNOSIS — T85528A Displacement of other gastrointestinal prosthetic devices, implants and grafts, initial encounter: Secondary | ICD-10-CM

## 2017-12-28 DIAGNOSIS — L400 Psoriasis vulgaris: Secondary | ICD-10-CM | POA: Diagnosis not present

## 2017-12-28 NOTE — Telephone Encounter (Signed)
Pt states he had an ERCP done at [facility] 12/15/17 and he is supposed to have a KUB done to see if he has passed the stent that was placed. [facility] let pt know that Dr. Havery Moros could order this for him. Please advise.

## 2017-12-28 NOTE — Telephone Encounter (Signed)
Left message for pt to call back  °

## 2017-12-28 NOTE — Telephone Encounter (Signed)
Pt returning your call to schedule.

## 2017-12-28 NOTE — Telephone Encounter (Signed)
Pt called stating that he needs to schedule an abd x-ray. His doctor at [facility] told him that he needs to have that done by his referring doctor's office. He said that Dr. Havery Moros referred him to [facility].

## 2017-12-28 NOTE — Telephone Encounter (Signed)
Linda, yes, please order him a KUB to be done so we can see if the pancreatic stent has passed. Thanks   Records reviewed from [place] as outlined: ERCP 12/15/2017 - Dr. 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 Dilated with a balloon, attempts at brushing not successful. Diffuse changes of intrahepatic ducts otherwise c/w PSC. One temporary stent was placed in the ventral PD. This will be scanned into Epic  They had recommended an MRCP in 3-6 months post procedure and a KUB to be done within 2 weeks to assess for passage of stent.

## 2017-12-29 NOTE — Telephone Encounter (Signed)
The pt has been advised and KUB in the system.  The pt will come in on Thursday morning

## 2017-12-29 NOTE — Telephone Encounter (Signed)
Left message on machine to call back  

## 2017-12-31 ENCOUNTER — Ambulatory Visit (INDEPENDENT_AMBULATORY_CARE_PROVIDER_SITE_OTHER)
Admission: RE | Admit: 2017-12-31 | Discharge: 2017-12-31 | Disposition: A | Discharge status: Home / Self Care | Payer: Medicare Other | Source: Ambulatory Visit | Attending: Gastroenterology | Admitting: Gastroenterology

## 2017-12-31 DIAGNOSIS — T85528A Displacement of other gastrointestinal prosthetic devices, implants and grafts, initial encounter: Secondary | ICD-10-CM

## 2017-12-31 DIAGNOSIS — Z4682 Encounter for fitting and adjustment of non-vascular catheter: Secondary | ICD-10-CM | POA: Diagnosis not present

## 2017-12-31 DIAGNOSIS — L400 Psoriasis vulgaris: Secondary | ICD-10-CM | POA: Diagnosis not present

## 2018-01-04 ENCOUNTER — Other Ambulatory Visit: Payer: Self-pay | Admitting: *Deleted

## 2018-01-04 ENCOUNTER — Telehealth: Payer: Self-pay | Admitting: Gastroenterology

## 2018-01-04 DIAGNOSIS — K8020 Calculus of gallbladder without cholecystitis without obstruction: Secondary | ICD-10-CM

## 2018-01-04 DIAGNOSIS — L400 Psoriasis vulgaris: Secondary | ICD-10-CM | POA: Diagnosis not present

## 2018-01-04 NOTE — Telephone Encounter (Signed)
Pt returned your call and would like a call again.

## 2018-01-04 NOTE — Telephone Encounter (Signed)
See result note.  

## 2018-01-07 DIAGNOSIS — L400 Psoriasis vulgaris: Secondary | ICD-10-CM | POA: Diagnosis not present

## 2018-01-11 DIAGNOSIS — L400 Psoriasis vulgaris: Secondary | ICD-10-CM | POA: Diagnosis not present

## 2018-01-14 ENCOUNTER — Ambulatory Visit (INDEPENDENT_AMBULATORY_CARE_PROVIDER_SITE_OTHER)
Admission: RE | Admit: 2018-01-14 | Discharge: 2018-01-14 | Disposition: A | Discharge status: Home / Self Care | Payer: Medicare Other | Source: Ambulatory Visit | Attending: Gastroenterology | Admitting: Gastroenterology

## 2018-01-14 ENCOUNTER — Other Ambulatory Visit (INDEPENDENT_AMBULATORY_CARE_PROVIDER_SITE_OTHER): Payer: Medicare Other

## 2018-01-14 ENCOUNTER — Telehealth: Payer: Self-pay | Admitting: Gastroenterology

## 2018-01-14 ENCOUNTER — Other Ambulatory Visit: Payer: Self-pay

## 2018-01-14 DIAGNOSIS — T85528A Displacement of other gastrointestinal prosthetic devices, implants and grafts, initial encounter: Secondary | ICD-10-CM | POA: Diagnosis not present

## 2018-01-14 DIAGNOSIS — K50919 Crohn's disease, unspecified, with unspecified complications: Secondary | ICD-10-CM

## 2018-01-14 DIAGNOSIS — K5090 Crohn's disease, unspecified, without complications: Secondary | ICD-10-CM | POA: Diagnosis not present

## 2018-01-14 DIAGNOSIS — K8020 Calculus of gallbladder without cholecystitis without obstruction: Secondary | ICD-10-CM | POA: Diagnosis not present

## 2018-01-14 DIAGNOSIS — L400 Psoriasis vulgaris: Secondary | ICD-10-CM | POA: Diagnosis not present

## 2018-01-14 LAB — HEPATIC FUNCTION PANEL
ALT: 97 U/L — AB (ref 0–53)
AST: 75 U/L — ABNORMAL HIGH (ref 0–37)
Albumin: 3.6 g/dL (ref 3.5–5.2)
Alkaline Phosphatase: 429 U/L — ABNORMAL HIGH (ref 39–117)
BILIRUBIN TOTAL: 1.7 mg/dL — AB (ref 0.2–1.2)
Bilirubin, Direct: 0.9 mg/dL — ABNORMAL HIGH (ref 0.0–0.3)
Total Protein: 7.8 g/dL (ref 6.0–8.3)

## 2018-01-14 NOTE — Telephone Encounter (Signed)
[facility] @ [facility].  She said she needs to give results to a nurse today before we close [phone]

## 2018-01-14 NOTE — Progress Notes (Unsigned)
The lab called because patient showed up thinking he was supposed to come for lab work that [facility] had ordered. We rec'd an unclear fax from them 2 weeks ago that we faxed back and asked for clarification but never heard back.  Patient indicated that [facility] wanted to redo his LFTs,

## 2018-01-14 NOTE — Telephone Encounter (Signed)
Spoke with radiology department. Confirmed the report they are calling about is the abd film. Confirmed reading as correct. Spoke with Dr Havery Moros. Forwarded radiology report to GI team at [place], NP.

## 2018-01-14 NOTE — Telephone Encounter (Signed)
Spoke with tracy from [facility] she wanted to speak with nurse to gv pt lab result.

## 2018-01-15 ENCOUNTER — Other Ambulatory Visit: Payer: Self-pay

## 2018-01-15 DIAGNOSIS — K50919 Crohn's disease, unspecified, with unspecified complications: Secondary | ICD-10-CM

## 2018-01-15 NOTE — Progress Notes (Signed)
LFT DUE IN 2 weeks.

## 2018-01-18 ENCOUNTER — Other Ambulatory Visit: Payer: Self-pay

## 2018-01-18 DIAGNOSIS — I83893 Varicose veins of bilateral lower extremities with other complications: Secondary | ICD-10-CM

## 2018-01-21 DIAGNOSIS — L400 Psoriasis vulgaris: Secondary | ICD-10-CM | POA: Diagnosis not present

## 2018-01-25 ENCOUNTER — Encounter: Payer: Self-pay | Admitting: Surgery

## 2018-01-25 ENCOUNTER — Ambulatory Visit (INDEPENDENT_AMBULATORY_CARE_PROVIDER_SITE_OTHER): Payer: Medicare Other | Admitting: Surgery

## 2018-01-25 ENCOUNTER — Other Ambulatory Visit: Payer: Self-pay

## 2018-01-25 ENCOUNTER — Ambulatory Visit (HOSPITAL_COMMUNITY)
Admission: RE | Admit: 2018-01-25 | Discharge: 2018-01-25 | Disposition: A | Discharge status: Home / Self Care | Payer: Medicare Other | Source: Ambulatory Visit | Attending: Surgery | Admitting: Surgery

## 2018-01-25 VITALS — BP 126/72 | HR 73 | Temp 97.7°F | Resp 16 | Ht 71.0 in | Wt 242.0 lb

## 2018-01-25 DIAGNOSIS — I83893 Varicose veins of bilateral lower extremities with other complications: Secondary | ICD-10-CM | POA: Diagnosis not present

## 2018-01-25 NOTE — Progress Notes (Signed)
[facility]  Patient name: Mark [name] MRN: [id number] DOB: Aug 05, 1945 Sex: male   REQUESTING PROVIDER:    Dr. Valere Dross   REASON FOR CONSULT:    Varicose veins with bleeding  HISTORY OF PRESENT ILLNESS:   Mark [name] is a 73 y.o. male, who had a episode of bleeding from a superficial vein back in November 2019.  He was referred today for further evaluation.  This was his only episode.  It stopped with pressure.  He is not on anticoagulation but does take an aspirin.  Patient is treated for Crohn's disease.  He is medically managed for hypertension.  He has had abnormal LFTs in the past as well as sclerosing cholangitis.  PAST MEDICAL HISTORY    Past Medical History:  Diagnosis Date  . Abnormal LFTs   . Benign prostatic hypertrophy   . Cholelithiasis    asymptomatic  . Crohn disease ([place])   . Hemolytic anemia ([place])   . Hyperglycemia 05/03/2015  . Hypertension   . Kidney stones    Kidney stones  . Psoriasis   . Sclerosing cholangitis      FAMILY HISTORY   Family History  Problem Relation Age of Onset  . Thyroid cancer Mother   . Lung cancer Father   . Breast cancer Unknown        aunt  . Lung cancer Unknown        aunt  . Clotting disorder Unknown        uncle  . Diabetes Brother   . Bladder Cancer Brother   . Colon cancer Neg Hx     SOCIAL HISTORY:   Social History   Socioeconomic History  . Marital status: Divorced    Spouse name: Not on file  . Number of children: 1  . Years of education: Not on file  . Highest education level: Not on file  Occupational History  . Occupation: Retired    Fish farm manager: RETIRED  Social Needs  . Financial resource strain: Not hard at all  . Food insecurity:    Worry: Never true    Inability: Never true  . Transportation needs:    Medical: No    Non-medical: No  Tobacco Use  . Smoking status: Former Smoker    Last attempt to quit:  01/06/1997    Years since quitting: 21.0  . Smokeless tobacco: Never Used  Substance and Sexual Activity  . Alcohol use: Yes    Alcohol/week: 35.0 standard drinks    Types: 35 Standard drinks or equivalent per week    Comment: occassional  . Drug use: No  . Sexual activity: Not Currently  Lifestyle  . Physical activity:    Days per week: 0 days    Minutes per session: 0 min  . Stress: Not at all  Relationships  . Social connections:    Talks on phone: More than three times a week    Gets together: More than three times a week    Attends religious service: Not on file    Active member of club or organization: Not on file    Attends meetings of clubs or organizations: Not on file    Relationship status: Divorced  . Intimate partner violence:    Fear of current or ex partner: Not on file    Emotionally abused: Not on file    Physically abused: Not on file    Forced sexual activity: Not on file  Other Topics Concern  .  Not on file  Social History Narrative  . Not on file    ALLERGIES:    Allergies  Allergen Reactions  . Penicillins Hives    REACTION: hives  . Typhoid Vaccines Rash and Other (See Comments)    "fever"    CURRENT MEDICATIONS:    Current Outpatient Medications  Medication Sig Dispense Refill  . Ascorbic Acid (VITAMIN C PO) Take 1 capsule by mouth daily.      Marland Kitchen aspirin 81 MG EC tablet TAKE 1 TABLET (81 MG TOTAL) BY MOUTH DAILY. 90 tablet 11  . b complex vitamins tablet Take 1 tablet by mouth daily.      . Calcium Carbonate-Vitamin D (CALCIUM + D PO) Take 1 capsule by mouth 2 (two) times daily.      . Iodoquinol-HC (HYDROCORTISONE-IODOQUINOL) 1-1 % CREA   3  . mesalamine (LIALDA) 1.2 g EC tablet Take 2 tablets (2.4 g total) by mouth daily with breakfast. 180 tablet 3  . Multiple Vitamins-Minerals (CENTRUM PO) Take 1 tablet by mouth daily.      Marland Kitchen telmisartan (MICARDIS) 80 MG tablet Take 1 tablet (80 mg total) by mouth daily. 90 tablet 3  . triamcinolone  cream (KENALOG) 0.1 % APPLY ON THE SKIN TWICE A DAY  2  . VITAMIN A PO Take 1 capsule by mouth daily.      No current facility-administered medications for this visit.     REVIEW OF SYSTEMS:   [X]  denotes positive finding, [ ]  denotes negative finding Cardiac  Comments:  Chest pain or chest pressure:    Shortness of breath upon exertion:    Short of breath when lying flat:    Irregular heart rhythm:        Vascular    Pain in calf, thigh, or hip brought on by ambulation:    Pain in feet at night that wakes you up from your sleep:     Blood clot in your veins:    Leg swelling:         Pulmonary    Oxygen at home:    Productive cough:     Wheezing:         Neurologic    Sudden weakness in arms or legs:     Sudden numbness in arms or legs:     Sudden onset of difficulty speaking or slurred speech:    Temporary loss of vision in one eye:     Problems with dizziness:         Gastrointestinal    Blood in stool:      Vomited blood:         Genitourinary    Burning when urinating:     Blood in urine:        Psychiatric    Major depression:         Hematologic    Bleeding problems:    Problems with blood clotting too easily:        Skin    Rashes or ulcers:        Constitutional    Fever or chills:     PHYSICAL EXAM:   Vitals:   01/25/18 1202  BP: 126/72  Pulse: 73  Resp: 16  Temp: 97.7 F (36.5 C)  TempSrc: Oral  SpO2: 98%  Weight: 242 lb (109.8 kg)  Height: 5' 11"  (1.803 m)    GENERAL: The patient is a well-nourished male, in no acute distress. The vital signs are documented above. CARDIAC: There  is a regular rate and rhythm.  VASCULAR: Multiple spider veins throughout both legs with no stigmata of recent bleed PULMONARY: Nonlabored respirations MUSCULOSKELETAL: There are no major deformities or cyanosis. NEUROLOGIC: No focal weakness or paresthesias are detected. SKIN: There are no ulcers or rashes noted. PSYCHIATRIC: The patient has a normal  affect.    STUDIES:   I have ordered and reviewed the following vascular lab studies: Venous Reflux Times Normal value < 0.5 sec +-------------+----------+---------+              Right (ms)Left (ms) +-------------+----------+---------+ GSV at knee  755.00    1753.00   +-------------+----------+---------+ GSV prox calf5002.00   1474.00   +-------------+----------+---------+  Vein Diameters: +------------------------------+----------+--------------+                               Right (cm)Left (cm)      +------------------------------+----------+--------------+ GSV at [place].492     0.365          +------------------------------+----------+--------------+ GSV at prox thigh             0.292     0.316          +------------------------------+----------+--------------+ GSV at mid thigh              0.315     0.231          +------------------------------+----------+--------------+ GSV at distal thigh           0.224     small branches +------------------------------+----------+--------------+ GSV at knee                   0.26      0.213          +------------------------------+----------+--------------+ GSV prox calf                 0.461     0.247          +------------------------------+----------+--------------+ GSV mid calf                                           +------------------------------+----------+--------------+ SSV origin                    0.233     0.365          +------------------------------+----------+--------------+ SSV prox                      0.196     0.536          +------------------------------+----------+--------------+ SSV mid                       0.239     0.438          +------------------------------+----------+--------------+       Summary: Right: Abnormal reflux times were noted in the great saphenous vein at the knee, and great saphenous vein at the prox calf.  There is no evidence of deep vein thrombosis in the lower extremity.There is no evidence of superficial venous thrombosis. Left: Abnormal reflux times were noted in the great saphenous vein at the knee, and great saphenous vein at the proximal calf. There is no evidence of deep vein thrombosis in the lower extremity.There is no evidence of superficial venous thrombosis.  ASSESSMENT and PLAN   Recent bleeding  from superficial spider vein in the right upper thigh: This is the patient's only episode.  His duplex today did not show a significant amount of venous insufficiency.  I doubt that he will have another episode.  I discussed that we could potentially consider sclerotherapy however we could just monitor him and if he has a second episode consider treating the vein at that time.  We have decided to monitor this and plan for treatment if he has another episode.  We discussed how to hold pressure if he does have another bleed.   Annamarie Major, MD [facility] [phone] Pager [phone]

## 2018-01-26 ENCOUNTER — Encounter: Payer: Self-pay | Admitting: Gastroenterology

## 2018-01-26 DIAGNOSIS — L400 Psoriasis vulgaris: Secondary | ICD-10-CM | POA: Diagnosis not present

## 2018-01-28 ENCOUNTER — Ambulatory Visit (AMBULATORY_SURGERY_CENTER): Payer: Self-pay

## 2018-01-28 VITALS — Ht 71.0 in | Wt 235.8 lb

## 2018-01-28 DIAGNOSIS — K50919 Crohn's disease, unspecified, with unspecified complications: Secondary | ICD-10-CM

## 2018-01-28 DIAGNOSIS — L400 Psoriasis vulgaris: Secondary | ICD-10-CM | POA: Diagnosis not present

## 2018-01-28 MED ORDER — NA SULFATE-K SULFATE-MG SULF 17.5-3.13-1.6 GM/177ML PO SOLN: 1.0000 | Freq: Once | ORAL | 0 refills | Status: AC

## 2018-01-28 NOTE — Progress Notes (Signed)
Per pt, no allergies to soy or egg products.Pt not taking any weight loss meds or using  O2 at home.  Pt refused emmi video. 

## 2018-02-01 ENCOUNTER — Other Ambulatory Visit (INDEPENDENT_AMBULATORY_CARE_PROVIDER_SITE_OTHER): Payer: Medicare Other

## 2018-02-01 ENCOUNTER — Other Ambulatory Visit: Payer: Self-pay

## 2018-02-01 DIAGNOSIS — K50919 Crohn's disease, unspecified, with unspecified complications: Secondary | ICD-10-CM

## 2018-02-01 DIAGNOSIS — L400 Psoriasis vulgaris: Secondary | ICD-10-CM | POA: Diagnosis not present

## 2018-02-01 LAB — HEPATIC FUNCTION PANEL
ALBUMIN: 3.5 g/dL (ref 3.5–5.2)
ALT: 90 U/L — ABNORMAL HIGH (ref 0–53)
AST: 75 U/L — ABNORMAL HIGH (ref 0–37)
Alkaline Phosphatase: 489 U/L — ABNORMAL HIGH (ref 39–117)
Bilirubin, Direct: 0.8 mg/dL — ABNORMAL HIGH (ref 0.0–0.3)
Total Bilirubin: 1.6 mg/dL — ABNORMAL HIGH (ref 0.2–1.2)
Total Protein: 7.3 g/dL (ref 6.0–8.3)

## 2018-02-01 NOTE — Progress Notes (Signed)
Per result note

## 2018-02-04 DIAGNOSIS — L400 Psoriasis vulgaris: Secondary | ICD-10-CM | POA: Diagnosis not present

## 2018-02-08 DIAGNOSIS — L400 Psoriasis vulgaris: Secondary | ICD-10-CM | POA: Diagnosis not present

## 2018-02-11 DIAGNOSIS — L400 Psoriasis vulgaris: Secondary | ICD-10-CM | POA: Diagnosis not present

## 2018-02-12 ENCOUNTER — Encounter: Payer: Self-pay | Admitting: Gastroenterology

## 2018-02-12 ENCOUNTER — Ambulatory Visit (AMBULATORY_SURGERY_CENTER): Payer: Medicare Other | Admitting: Gastroenterology

## 2018-02-12 VITALS — BP 146/70 | HR 62 | Temp 98.6°F | Resp 18 | Ht 71.0 in | Wt 235.0 lb

## 2018-02-12 DIAGNOSIS — K8301 Primary sclerosing cholangitis: Secondary | ICD-10-CM

## 2018-02-12 DIAGNOSIS — Z1211 Encounter for screening for malignant neoplasm of colon: Secondary | ICD-10-CM | POA: Diagnosis not present

## 2018-02-12 DIAGNOSIS — D122 Benign neoplasm of ascending colon: Secondary | ICD-10-CM

## 2018-02-12 DIAGNOSIS — D128 Benign neoplasm of rectum: Secondary | ICD-10-CM | POA: Diagnosis not present

## 2018-02-12 DIAGNOSIS — K529 Noninfective gastroenteritis and colitis, unspecified: Secondary | ICD-10-CM | POA: Diagnosis not present

## 2018-02-12 DIAGNOSIS — Z86010 Personal history of colonic polyps: Secondary | ICD-10-CM

## 2018-02-12 DIAGNOSIS — K50919 Crohn's disease, unspecified, with unspecified complications: Secondary | ICD-10-CM

## 2018-02-12 DIAGNOSIS — D129 Benign neoplasm of anus and anal canal: Secondary | ICD-10-CM

## 2018-02-12 DIAGNOSIS — D120 Benign neoplasm of cecum: Secondary | ICD-10-CM

## 2018-02-12 DIAGNOSIS — K6389 Other specified diseases of intestine: Secondary | ICD-10-CM | POA: Diagnosis not present

## 2018-02-12 MED ORDER — SODIUM CHLORIDE 0.9 % IV SOLN: 500.0000 mL | Freq: Once | INTRAVENOUS | Status: DC

## 2018-02-12 NOTE — Patient Instructions (Signed)
   Thank you for allowing [place] to care for you today.    Await pathology results, approximately 2 weeks.  Resume previous diet and medications today.  Return to normal activities tomorrow.  Handouts provided for polyps.    YOU HAD AN ENDOSCOPIC PROCEDURE TODAY AT [facility]:   Refer to the procedure report that was given to you for any specific questions about what was found during the examination.  If the procedure report does not answer your questions, please call your gastroenterologist to clarify.  If you requested that your care partner not be given the details of your procedure findings, then the procedure report has been included in a sealed envelope for you to review at your convenience later.  YOU SHOULD EXPECT: Some feelings of bloating in the abdomen. Passage of more gas than usual.  Walking can help get rid of the air that was put into your GI tract during the procedure and reduce the bloating. If you had a lower endoscopy (such as a colonoscopy or flexible sigmoidoscopy) you may notice spotting of blood in your stool or on the toilet paper. If you underwent a bowel prep for your procedure, you may not have a normal bowel movement for a few days.  Please Note:  You might notice some irritation and congestion in your nose or some drainage.  This is from the oxygen used during your procedure.  There is no need for concern and it should clear up in a day or so.  SYMPTOMS TO REPORT IMMEDIATELY:   Following lower endoscopy (colonoscopy or flexible sigmoidoscopy):  Excessive amounts of blood in the stool  Significant tenderness or worsening of abdominal pains  Swelling of the abdomen that is new, acute  Fever of 100F or higher   For urgent or emergent issues, a gastroenterologist can be reached at any hour by calling [phone].   DIET:  We do recommend a small meal at first, but then you may proceed to your regular diet.  Drink plenty of fluids but you should  avoid alcoholic beverages for 24 hours.  ACTIVITY:  You should plan to take it easy for the rest of today and you should NOT DRIVE or use heavy machinery until tomorrow (because of the sedation medicines used during the test).    FOLLOW UP: Our staff will call the number listed on your records the next business day following your procedure to check on you and address any questions or concerns that you may have regarding the information given to you following your procedure. If we do not reach you, we will leave a message.  However, if you are feeling well and you are not experiencing any problems, there is no need to return our call.  We will assume that you have returned to your regular daily activities without incident.  If any biopsies were taken you will be contacted by phone or by letter within the next 1-3 weeks.  Please call us at [phone] if you have not heard about the biopsies in 3 weeks.    SIGNATURES/CONFIDENTIALITY: You and/or your care partner have signed paperwork which will be entered into your electronic medical record.  These signatures attest to the fact that that the information above on your After Visit Summary has been reviewed and is understood.  Full responsibility of the confidentiality of this discharge information lies with you and/or your care-partner.

## 2018-02-12 NOTE — Progress Notes (Signed)
Called to room to assist during endoscopic procedure.  Patient ID and intended procedure confirmed with present staff. Received instructions for my participation in the procedure from the performing physician.  

## 2018-02-12 NOTE — Progress Notes (Signed)
To PACU, VSS. Report to [email or web address].tb 

## 2018-02-12 NOTE — Op Note (Signed)
[facility] Patient Name: Mark [name] Procedure Date: 02/12/2018 7:31 AM MRN: [id number] Endoscopist: Remo Lipps P. Havery Moros , MD Age: 73 Referring MD:  Date of Birth: March 17, 1945 Gender: Male Account #: [id number] Procedure:                Colonoscopy Indications:              High risk colon cancer surveillance: colitis,                            history of PSC and pseudoinflammatory polyps, on                            mesalamine with no symptoms Medicines:                Monitored Anesthesia Care Procedure:                Pre-Anesthesia Assessment:                           - Prior to the procedure, a History and Physical                            was performed, and patient medications and                            allergies were reviewed. The patient's tolerance of                            previous anesthesia was also reviewed. The risks                            and benefits of the procedure and the sedation                            options and risks were discussed with the patient.                            All questions were answered, and informed consent                            was obtained. Prior Anticoagulants: The patient has                            taken no previous anticoagulant or antiplatelet                            agents. ASA Grade Assessment: II - A patient with                            mild systemic disease. After reviewing the risks                            and benefits, the patient was deemed in  satisfactory condition to undergo the procedure.                           After obtaining informed consent, the colonoscope                            was passed under direct vision. Throughout the                            procedure, the patient's blood pressure, pulse, and                            oxygen saturations were monitored continuously. The                            Colonoscope was introduced  through the anus and                            advanced to the the terminal ileum, with                            identification of the appendiceal orifice and IC                            valve. The colonoscopy was performed without                            difficulty. The patient tolerated the procedure                            well. The quality of the bowel preparation was                            good. The terminal ileum, ileocecal valve,                            appendiceal orifice, and rectum were photographed. Scope In: 8:01:25 AM Scope Out: 8:37:58 AM Scope Withdrawal Time: 0 hours 32 minutes 22 seconds  Total Procedure Duration: 0 hours 36 minutes 33 seconds  Findings:                 The perianal and digital rectal examinations were                            normal.                           Patchy areas of mucosa in the terminal ileum was                            mildly erythematous. Biopsies were taken with a                            cold forceps for histology.  Two sessile polyps were found in the cecum. The                            polyps were 3 to 7 mm in size. These polyps were                            removed with a cold snare. Resection and retrieval                            were complete.                           A 10 to 15 mm polyp was found in the ascending                            colon - 2 folds back from IC valve. The polyp was                            sessile wrapping over a fold. The polyp was removed                            with a piecemeal technique using a cold snare.                            Resection and retrieval were complete.                           Two sessile polyps were found in the rectum. The                            polyps were 3 to 4 mm in size. These polyps were                            removed with a cold snare upon insertion with the                            colonoscopy. Upon  withdrawal, there was active                            oozing from one site which was treated with                            coagulation using snare tip with good result.                            Resection and retrieval were complete.                           A few small-mouthed diverticula were found in the                            sigmoid colon.  Diffuse pseudopolyps were found in the sigmoid                            colon, in the descending colon, at the splenic                            flexure, in the transverse colon, at the hepatic                            flexure and in the ascending colon. Biopsies were                            taken in all segments of the colon with a cold                            forceps for histology.                           Internal hemorrhoids were found during                            retroflexion. The hemorrhoids were small.                           The exam was otherwise without abnormality. No                            active inflammation appreciated Complications:            No immediate complications. Estimated blood loss:                            Minimal. Estimated Blood Loss:     Estimated blood loss was minimal. Impression:               - Erythematous mucosa in the terminal ileum.                            Biopsied.                           - Two 3 to 7 mm polyps in the cecum, removed with a                            cold snare. Resected and retrieved.                           - One 10 to 15 mm polyp in the ascending colon,                            removed piecemeal using a cold snare. Resected and                            retrieved.                           -  Two 3 to 4 mm polyps in the rectum, removed with                            a cold snare. Resected and retrieved.                           - Diverticulosis in the sigmoid colon.                           - Pseudopolyps in the sigmoid  colon, in the                            descending colon, at the splenic flexure, in the                            transverse colon, at the hepatic flexure and in the                            ascending colon. Surveillance biopsies obtained.                           - Internal hemorrhoids.                           - The examination was otherwise normal. Recommendation:           - Patient has a contact number available for                            emergencies. The signs and symptoms of potential                            delayed complications were discussed with the                            patient. Return to normal activities tomorrow.                            Written discharge instructions were provided to the                            patient.                           - Resume previous diet.                           - Continue present medications.                           - Await pathology results. Remo Lipps P. Armbruster, MD 02/12/2018 8:47:10 AM This report has been signed electronically.

## 2018-02-15 ENCOUNTER — Telehealth: Payer: Self-pay

## 2018-02-15 DIAGNOSIS — L400 Psoriasis vulgaris: Secondary | ICD-10-CM | POA: Diagnosis not present

## 2018-02-15 NOTE — Telephone Encounter (Signed)
Left message on answering machine. 

## 2018-02-17 ENCOUNTER — Telehealth: Payer: Self-pay

## 2018-02-17 NOTE — Telephone Encounter (Signed)
-----   Message from Roetta Sessions, [place] sent at 02/01/2018  5:41 PM EST ----- Regarding: LFTs due Recheck LFTs due week of 2-17.  Send results to Dr. Tillie Rung at [facility] fax:  [phone]  [phone]

## 2018-02-17 NOTE — Telephone Encounter (Signed)
Called and LM for pt to go to the lab next week for LFTs.  Also send letter to pt to go to lab. Will need to send results to [facility], Dr. Mont Dutton [phone] once they result

## 2018-02-18 DIAGNOSIS — L400 Psoriasis vulgaris: Secondary | ICD-10-CM | POA: Diagnosis not present

## 2018-02-22 ENCOUNTER — Other Ambulatory Visit (INDEPENDENT_AMBULATORY_CARE_PROVIDER_SITE_OTHER): Payer: Medicare Other

## 2018-02-22 DIAGNOSIS — K50919 Crohn's disease, unspecified, with unspecified complications: Secondary | ICD-10-CM

## 2018-02-22 DIAGNOSIS — L400 Psoriasis vulgaris: Secondary | ICD-10-CM | POA: Diagnosis not present

## 2018-02-22 LAB — HEPATIC FUNCTION PANEL
ALBUMIN: 3.5 g/dL (ref 3.5–5.2)
ALT: 164 U/L — ABNORMAL HIGH (ref 0–53)
AST: 130 U/L — ABNORMAL HIGH (ref 0–37)
Alkaline Phosphatase: 561 U/L — ABNORMAL HIGH (ref 39–117)
Bilirubin, Direct: 1.3 mg/dL — ABNORMAL HIGH (ref 0.0–0.3)
Total Bilirubin: 2.3 mg/dL — ABNORMAL HIGH (ref 0.2–1.2)
Total Protein: 7.5 g/dL (ref 6.0–8.3)

## 2018-02-23 NOTE — Telephone Encounter (Signed)
Labs printed and faxed to [facility], Dr. Mont Dutton at [phone]

## 2018-02-24 ENCOUNTER — Encounter: Payer: Self-pay | Admitting: Gastroenterology

## 2018-02-25 DIAGNOSIS — L400 Psoriasis vulgaris: Secondary | ICD-10-CM | POA: Diagnosis not present

## 2018-03-02 DIAGNOSIS — L400 Psoriasis vulgaris: Secondary | ICD-10-CM | POA: Diagnosis not present

## 2018-03-04 DIAGNOSIS — L400 Psoriasis vulgaris: Secondary | ICD-10-CM | POA: Diagnosis not present

## 2018-03-08 DIAGNOSIS — L400 Psoriasis vulgaris: Secondary | ICD-10-CM | POA: Diagnosis not present

## 2018-03-11 DIAGNOSIS — L400 Psoriasis vulgaris: Secondary | ICD-10-CM | POA: Diagnosis not present

## 2018-03-15 DIAGNOSIS — L400 Psoriasis vulgaris: Secondary | ICD-10-CM | POA: Diagnosis not present

## 2018-03-16 DIAGNOSIS — R748 Abnormal levels of other serum enzymes: Secondary | ICD-10-CM | POA: Diagnosis not present

## 2018-03-16 DIAGNOSIS — R932 Abnormal findings on diagnostic imaging of liver and biliary tract: Secondary | ICD-10-CM | POA: Diagnosis not present

## 2018-03-16 DIAGNOSIS — K838 Other specified diseases of biliary tract: Secondary | ICD-10-CM | POA: Diagnosis not present

## 2018-03-16 DIAGNOSIS — Z6832 Body mass index (BMI) 32.0-32.9, adult: Secondary | ICD-10-CM | POA: Diagnosis not present

## 2018-03-16 DIAGNOSIS — K589 Irritable bowel syndrome without diarrhea: Secondary | ICD-10-CM | POA: Diagnosis not present

## 2018-03-16 DIAGNOSIS — I10 Essential (primary) hypertension: Secondary | ICD-10-CM | POA: Diagnosis not present

## 2018-03-16 DIAGNOSIS — K831 Obstruction of bile duct: Secondary | ICD-10-CM | POA: Diagnosis not present

## 2018-03-16 DIAGNOSIS — K8020 Calculus of gallbladder without cholecystitis without obstruction: Secondary | ICD-10-CM | POA: Diagnosis not present

## 2018-03-16 DIAGNOSIS — K8021 Calculus of gallbladder without cholecystitis with obstruction: Secondary | ICD-10-CM | POA: Diagnosis not present

## 2018-03-16 DIAGNOSIS — Z9889 Other specified postprocedural states: Secondary | ICD-10-CM | POA: Diagnosis not present

## 2018-03-16 DIAGNOSIS — E669 Obesity, unspecified: Secondary | ICD-10-CM | POA: Diagnosis not present

## 2018-03-16 DIAGNOSIS — K8301 Primary sclerosing cholangitis: Secondary | ICD-10-CM | POA: Diagnosis not present

## 2018-03-18 DIAGNOSIS — L400 Psoriasis vulgaris: Secondary | ICD-10-CM | POA: Diagnosis not present

## 2018-03-22 DIAGNOSIS — L400 Psoriasis vulgaris: Secondary | ICD-10-CM | POA: Diagnosis not present

## 2018-03-25 DIAGNOSIS — L400 Psoriasis vulgaris: Secondary | ICD-10-CM | POA: Diagnosis not present

## 2018-03-29 DIAGNOSIS — L400 Psoriasis vulgaris: Secondary | ICD-10-CM | POA: Diagnosis not present

## 2018-04-01 DIAGNOSIS — L400 Psoriasis vulgaris: Secondary | ICD-10-CM | POA: Diagnosis not present

## 2018-04-06 DIAGNOSIS — L400 Psoriasis vulgaris: Secondary | ICD-10-CM | POA: Diagnosis not present

## 2018-04-08 DIAGNOSIS — L400 Psoriasis vulgaris: Secondary | ICD-10-CM | POA: Diagnosis not present

## 2018-04-12 DIAGNOSIS — L400 Psoriasis vulgaris: Secondary | ICD-10-CM | POA: Diagnosis not present

## 2018-04-15 DIAGNOSIS — L400 Psoriasis vulgaris: Secondary | ICD-10-CM | POA: Diagnosis not present

## 2018-04-19 DIAGNOSIS — L400 Psoriasis vulgaris: Secondary | ICD-10-CM | POA: Diagnosis not present

## 2018-04-20 DIAGNOSIS — K8301 Primary sclerosing cholangitis: Secondary | ICD-10-CM | POA: Diagnosis not present

## 2018-04-20 DIAGNOSIS — K8020 Calculus of gallbladder without cholecystitis without obstruction: Secondary | ICD-10-CM | POA: Diagnosis not present

## 2018-04-22 DIAGNOSIS — L400 Psoriasis vulgaris: Secondary | ICD-10-CM | POA: Diagnosis not present

## 2018-04-26 DIAGNOSIS — L400 Psoriasis vulgaris: Secondary | ICD-10-CM | POA: Diagnosis not present

## 2018-04-29 DIAGNOSIS — L400 Psoriasis vulgaris: Secondary | ICD-10-CM | POA: Diagnosis not present

## 2018-05-03 DIAGNOSIS — L400 Psoriasis vulgaris: Secondary | ICD-10-CM | POA: Diagnosis not present

## 2018-05-05 DIAGNOSIS — L400 Psoriasis vulgaris: Secondary | ICD-10-CM | POA: Diagnosis not present

## 2018-05-06 ENCOUNTER — Other Ambulatory Visit: Payer: Self-pay | Admitting: Internal Medicine

## 2018-05-10 ENCOUNTER — Other Ambulatory Visit (INDEPENDENT_AMBULATORY_CARE_PROVIDER_SITE_OTHER): Payer: Medicare Other

## 2018-05-10 ENCOUNTER — Other Ambulatory Visit: Payer: Self-pay

## 2018-05-10 DIAGNOSIS — K50919 Crohn's disease, unspecified, with unspecified complications: Secondary | ICD-10-CM | POA: Diagnosis not present

## 2018-05-10 DIAGNOSIS — L400 Psoriasis vulgaris: Secondary | ICD-10-CM | POA: Diagnosis not present

## 2018-05-10 LAB — HEPATIC FUNCTION PANEL
ALT: 152 U/L — ABNORMAL HIGH (ref 0–53)
AST: 122 U/L — ABNORMAL HIGH (ref 0–37)
Albumin: 3.5 g/dL (ref 3.5–5.2)
Alkaline Phosphatase: 414 U/L — ABNORMAL HIGH (ref 39–117)
Bilirubin, Direct: 0.6 mg/dL — ABNORMAL HIGH (ref 0.0–0.3)
Total Bilirubin: 1.5 mg/dL — ABNORMAL HIGH (ref 0.2–1.2)
Total Protein: 7.9 g/dL (ref 6.0–8.3)

## 2018-05-10 NOTE — Progress Notes (Signed)
Orders faxed from [place] for pt to have labs drawn dowstairs.

## 2018-05-13 DIAGNOSIS — L400 Psoriasis vulgaris: Secondary | ICD-10-CM | POA: Diagnosis not present

## 2018-05-17 DIAGNOSIS — L400 Psoriasis vulgaris: Secondary | ICD-10-CM | POA: Diagnosis not present

## 2018-05-20 DIAGNOSIS — L400 Psoriasis vulgaris: Secondary | ICD-10-CM | POA: Diagnosis not present

## 2018-05-24 DIAGNOSIS — L400 Psoriasis vulgaris: Secondary | ICD-10-CM | POA: Diagnosis not present

## 2018-05-25 DIAGNOSIS — Z1159 Encounter for screening for other viral diseases: Secondary | ICD-10-CM | POA: Diagnosis not present

## 2018-05-25 DIAGNOSIS — Z01812 Encounter for preprocedural laboratory examination: Secondary | ICD-10-CM | POA: Diagnosis not present

## 2018-05-27 DIAGNOSIS — L400 Psoriasis vulgaris: Secondary | ICD-10-CM | POA: Diagnosis not present

## 2018-05-28 ENCOUNTER — Encounter: Payer: Self-pay | Admitting: Gastroenterology

## 2018-05-28 DIAGNOSIS — K838 Other specified diseases of biliary tract: Secondary | ICD-10-CM | POA: Diagnosis not present

## 2018-05-28 DIAGNOSIS — Z4659 Encounter for fitting and adjustment of other gastrointestinal appliance and device: Secondary | ICD-10-CM | POA: Diagnosis not present

## 2018-05-28 DIAGNOSIS — K8050 Calculus of bile duct without cholangitis or cholecystitis without obstruction: Secondary | ICD-10-CM | POA: Diagnosis not present

## 2018-05-28 DIAGNOSIS — Z87891 Personal history of nicotine dependence: Secondary | ICD-10-CM | POA: Diagnosis not present

## 2018-05-28 DIAGNOSIS — K589 Irritable bowel syndrome without diarrhea: Secondary | ICD-10-CM | POA: Diagnosis not present

## 2018-05-28 DIAGNOSIS — Z9689 Presence of other specified functional implants: Secondary | ICD-10-CM | POA: Diagnosis not present

## 2018-05-28 DIAGNOSIS — K8020 Calculus of gallbladder without cholecystitis without obstruction: Secondary | ICD-10-CM | POA: Diagnosis not present

## 2018-05-28 DIAGNOSIS — R748 Abnormal levels of other serum enzymes: Secondary | ICD-10-CM | POA: Diagnosis not present

## 2018-05-28 DIAGNOSIS — Z7982 Long term (current) use of aspirin: Secondary | ICD-10-CM | POA: Diagnosis not present

## 2018-05-28 DIAGNOSIS — I10 Essential (primary) hypertension: Secondary | ICD-10-CM | POA: Diagnosis not present

## 2018-05-28 DIAGNOSIS — Z9889 Other specified postprocedural states: Secondary | ICD-10-CM | POA: Diagnosis not present

## 2018-05-28 DIAGNOSIS — K8021 Calculus of gallbladder without cholecystitis with obstruction: Secondary | ICD-10-CM | POA: Diagnosis not present

## 2018-05-28 DIAGNOSIS — Z79899 Other long term (current) drug therapy: Secondary | ICD-10-CM | POA: Diagnosis not present

## 2018-05-28 DIAGNOSIS — K8301 Primary sclerosing cholangitis: Secondary | ICD-10-CM | POA: Diagnosis not present

## 2018-05-28 DIAGNOSIS — R932 Abnormal findings on diagnostic imaging of liver and biliary tract: Secondary | ICD-10-CM | POA: Diagnosis not present

## 2018-05-28 DIAGNOSIS — Z4589 Encounter for adjustment and management of other implanted devices: Secondary | ICD-10-CM | POA: Diagnosis not present

## 2018-05-28 DIAGNOSIS — E669 Obesity, unspecified: Secondary | ICD-10-CM | POA: Diagnosis not present

## 2018-05-28 DIAGNOSIS — L409 Psoriasis, unspecified: Secondary | ICD-10-CM | POA: Diagnosis not present

## 2018-05-29 ENCOUNTER — Encounter: Payer: Self-pay | Admitting: Internal Medicine

## 2018-06-01 DIAGNOSIS — L400 Psoriasis vulgaris: Secondary | ICD-10-CM | POA: Diagnosis not present

## 2018-06-03 DIAGNOSIS — L400 Psoriasis vulgaris: Secondary | ICD-10-CM | POA: Diagnosis not present

## 2018-06-04 ENCOUNTER — Telehealth: Payer: Self-pay | Admitting: Gastroenterology

## 2018-06-04 NOTE — Telephone Encounter (Signed)
Received ERCP report from Dr. Mont Dutton at [facility] done on 05/28/18 - had cholangiogram done, no dominant stricture noted. Stent not replaced. One prophylactic pancreatic stent was placed.   She is recommending a KUB xray in 10-14 days from the exam to ensure the stent has passed.  Sherlynn Stalls can you contact the patient to coordinate KUB for sometime next week. Thanks

## 2018-06-07 ENCOUNTER — Other Ambulatory Visit: Payer: Self-pay

## 2018-06-07 DIAGNOSIS — L400 Psoriasis vulgaris: Secondary | ICD-10-CM | POA: Diagnosis not present

## 2018-06-07 DIAGNOSIS — K50919 Crohn's disease, unspecified, with unspecified complications: Secondary | ICD-10-CM

## 2018-06-07 NOTE — Telephone Encounter (Signed)
KUB order in Epic. Patient notified to come into the basement next week. Mon.-Fri. Bwt. 8:30am-4:00pm to have it done.

## 2018-06-07 NOTE — Telephone Encounter (Signed)
Left message for patient to call back  

## 2018-06-10 DIAGNOSIS — L400 Psoriasis vulgaris: Secondary | ICD-10-CM | POA: Diagnosis not present

## 2018-06-14 ENCOUNTER — Ambulatory Visit (INDEPENDENT_AMBULATORY_CARE_PROVIDER_SITE_OTHER)
Admission: RE | Admit: 2018-06-14 | Discharge: 2018-06-14 | Disposition: A | Discharge status: Home / Self Care | Payer: Medicare Other | Source: Ambulatory Visit | Attending: Gastroenterology | Admitting: Gastroenterology

## 2018-06-14 ENCOUNTER — Other Ambulatory Visit: Payer: Self-pay

## 2018-06-14 DIAGNOSIS — L400 Psoriasis vulgaris: Secondary | ICD-10-CM | POA: Diagnosis not present

## 2018-06-14 DIAGNOSIS — K50919 Crohn's disease, unspecified, with unspecified complications: Secondary | ICD-10-CM | POA: Diagnosis not present

## 2018-06-14 DIAGNOSIS — K8020 Calculus of gallbladder without cholecystitis without obstruction: Secondary | ICD-10-CM | POA: Diagnosis not present

## 2018-06-16 ENCOUNTER — Other Ambulatory Visit: Payer: Self-pay

## 2018-06-16 ENCOUNTER — Telehealth: Payer: Self-pay

## 2018-06-16 DIAGNOSIS — K50919 Crohn's disease, unspecified, with unspecified complications: Secondary | ICD-10-CM

## 2018-06-16 NOTE — Telephone Encounter (Signed)
Left message to call back  

## 2018-06-17 DIAGNOSIS — L400 Psoriasis vulgaris: Secondary | ICD-10-CM | POA: Diagnosis not present

## 2018-06-21 DIAGNOSIS — L400 Psoriasis vulgaris: Secondary | ICD-10-CM | POA: Diagnosis not present

## 2018-06-24 DIAGNOSIS — L400 Psoriasis vulgaris: Secondary | ICD-10-CM | POA: Diagnosis not present

## 2018-06-28 ENCOUNTER — Telehealth: Payer: Self-pay | Admitting: Gastroenterology

## 2018-06-28 ENCOUNTER — Other Ambulatory Visit (INDEPENDENT_AMBULATORY_CARE_PROVIDER_SITE_OTHER): Payer: Medicare Other

## 2018-06-28 DIAGNOSIS — K50919 Crohn's disease, unspecified, with unspecified complications: Secondary | ICD-10-CM | POA: Diagnosis not present

## 2018-06-28 DIAGNOSIS — L400 Psoriasis vulgaris: Secondary | ICD-10-CM | POA: Diagnosis not present

## 2018-06-28 LAB — HEPATIC FUNCTION PANEL
ALT: 137 U/L — ABNORMAL HIGH (ref 0–53)
AST: 119 U/L — ABNORMAL HIGH (ref 0–37)
Albumin: 3.5 g/dL (ref 3.5–5.2)
Alkaline Phosphatase: 444 U/L — ABNORMAL HIGH (ref 39–117)
Bilirubin, Direct: 0.6 mg/dL — ABNORMAL HIGH (ref 0.0–0.3)
Total Bilirubin: 1.4 mg/dL — ABNORMAL HIGH (ref 0.2–1.2)
Total Protein: 7.5 g/dL (ref 6.0–8.3)

## 2018-06-28 NOTE — Telephone Encounter (Signed)
Patient states he is returning your call regarding medication

## 2018-06-28 NOTE — Telephone Encounter (Signed)
See phone note

## 2018-07-01 DIAGNOSIS — L400 Psoriasis vulgaris: Secondary | ICD-10-CM | POA: Diagnosis not present

## 2018-07-05 DIAGNOSIS — L400 Psoriasis vulgaris: Secondary | ICD-10-CM | POA: Diagnosis not present

## 2018-07-08 DIAGNOSIS — L400 Psoriasis vulgaris: Secondary | ICD-10-CM | POA: Diagnosis not present

## 2018-07-12 DIAGNOSIS — L400 Psoriasis vulgaris: Secondary | ICD-10-CM | POA: Diagnosis not present

## 2018-07-19 DIAGNOSIS — L400 Psoriasis vulgaris: Secondary | ICD-10-CM | POA: Diagnosis not present

## 2018-07-22 DIAGNOSIS — L400 Psoriasis vulgaris: Secondary | ICD-10-CM | POA: Diagnosis not present

## 2018-07-26 ENCOUNTER — Telehealth: Payer: Self-pay | Admitting: Internal Medicine

## 2018-07-26 DIAGNOSIS — L400 Psoriasis vulgaris: Secondary | ICD-10-CM | POA: Diagnosis not present

## 2018-07-26 NOTE — Telephone Encounter (Signed)
Patient called stating that he has been having a cough with phlegm, headache, body aches, fatigue, low grade fever and chills when it all stated. He said that this has been going on for 2 weeks. Patient is unable to schedule a virtual visit.  Please advise how to proceed.

## 2018-07-26 NOTE — Telephone Encounter (Signed)
Left message for pt to call back to schedule.

## 2018-07-27 ENCOUNTER — Ambulatory Visit (INDEPENDENT_AMBULATORY_CARE_PROVIDER_SITE_OTHER): Payer: Medicare Other | Admitting: Internal Medicine

## 2018-07-27 ENCOUNTER — Encounter: Payer: Self-pay | Admitting: Internal Medicine

## 2018-07-27 ENCOUNTER — Other Ambulatory Visit: Payer: Self-pay

## 2018-07-27 DIAGNOSIS — Z20828 Contact with and (suspected) exposure to other viral communicable diseases: Secondary | ICD-10-CM | POA: Diagnosis not present

## 2018-07-27 DIAGNOSIS — R05 Cough: Secondary | ICD-10-CM

## 2018-07-27 DIAGNOSIS — R059 Cough, unspecified: Secondary | ICD-10-CM

## 2018-07-27 DIAGNOSIS — Z20822 Contact with and (suspected) exposure to covid-19: Secondary | ICD-10-CM

## 2018-07-27 DIAGNOSIS — R6889 Other general symptoms and signs: Secondary | ICD-10-CM | POA: Diagnosis not present

## 2018-07-27 MED ORDER — PREDNISONE 10 MG PO TABS: ORAL_TABLET | ORAL | 0 refills | Status: DC

## 2018-07-27 MED ORDER — LEVOFLOXACIN 500 MG PO TABS: 500.0000 mg | ORAL_TABLET | Freq: Every day | ORAL | 0 refills | Status: AC

## 2018-07-27 MED ORDER — HYDROCODONE-HOMATROPINE 5-1.5 MG/5ML PO SYRP: 5.0000 mL | ORAL_SOLUTION | Freq: Four times a day (QID) | ORAL | 0 refills | Status: AC | PRN

## 2018-07-27 NOTE — Assessment & Plan Note (Signed)
Mild to mod, for tx as above,  to f/u any worsening symptoms or concerns

## 2018-07-27 NOTE — Progress Notes (Signed)
Patient ID: Mark [name], male   DOB: December 04, 1945, 73 y.o.   MRN: [id number]  Phone note  Cumulative time during 7-day interval 51mn, there was not an associated office visit for this concern within a 7 day period.  Verbal consent for services obtained from patient prior to services given.  Names of all persons present for services: JCathlean Cower MD,   Chief complaint: cough  History, background, results pertinent:  Here with acute onset mild to mod 9 days ST, HA, general weakness and malaise, with prod cough clear sputum with mild sob and wheezine, but Pt denies chest pain, orthopnea, PND, increased LE swelling, palpitations, dizziness or syncope.  No known COVID exposure  No other new complaints  Past Medical History:  Diagnosis Date  . Abnormal LFTs   . Benign prostatic hypertrophy   . Cancer ([place]    skin cancer on face  . Cataract    no surgery  . Cholelithiasis    asymptomatic  . Crohn disease ([place]   . [place]olytic [place] ([place]   . Hyperglycemia 05/03/2015  . Hypertension   . Kidney stones    Kidney stones  . Psoriasis   . Sclerosing cholangitis    No results found for this or any previous visit (from the past 438hour(s)). Lab Results  Component Value Date   WBC 8.8 11/09/2017   HGB 13.9 11/09/2017   HCT 40.0 11/09/2017   PLT 325.0 11/09/2017   GLUCOSE 83 04/07/2017   CHOL 262 (H) 04/07/2017   TRIG 83.0 04/07/2017   HDL 79.70 04/07/2017   LDLCALC 166 (H) 04/07/2017   ALT 137 (H) 06/28/2018   AST 119 (H) 06/28/2018   NA 136 04/07/2017   K 4.4 04/07/2017   CL 100 04/07/2017   CREATININE 0.71 04/07/2017   BUN 10 04/07/2017   CO2 30 04/07/2017   TSH 1.14 04/07/2017   PSA 1.62 04/07/2017   HGBA1C 5.1 04/07/2017    A/P/next steps:   Cough with wheezing - no hx of COPD, more likely asthmatic bronchitis, posssibly viral vs other CAP; for antibx, cough med prn, predpac asd, and also refer for COVID testing  JCathlean CowerMD

## 2018-07-27 NOTE — Patient Instructions (Signed)
Please take all new medication as prescribed  Please go for the COVID testing to the [facility] site  Please continue all other medications as before, and refills have been done if requested.  Please have the pharmacy call with any other refills you may need  Please keep your appointments with your specialists as you may have planned

## 2018-07-29 DIAGNOSIS — L400 Psoriasis vulgaris: Secondary | ICD-10-CM | POA: Diagnosis not present

## 2018-07-30 LAB — NOVEL CORONAVIRUS, NAA: SARS-CoV-2, NAA: NOT DETECTED

## 2018-07-31 ENCOUNTER — Encounter: Payer: Self-pay | Admitting: Internal Medicine

## 2018-08-02 ENCOUNTER — Telehealth: Payer: Self-pay

## 2018-08-02 DIAGNOSIS — L400 Psoriasis vulgaris: Secondary | ICD-10-CM | POA: Diagnosis not present

## 2018-08-02 NOTE — Telephone Encounter (Signed)
Called pt, LVM.   CRM created.  

## 2018-08-02 NOTE — Telephone Encounter (Signed)
Patient called back. Gave results, pt expressed understanding

## 2018-08-02 NOTE — Telephone Encounter (Signed)
-----   Message from Biagio Borg, MD sent at 07/31/2018  8:17 PM EDT ----- Letter sent, cont same tx   Joyce Leckey to please inform pt, COVID neg

## 2018-08-05 DIAGNOSIS — L400 Psoriasis vulgaris: Secondary | ICD-10-CM | POA: Diagnosis not present

## 2018-08-09 DIAGNOSIS — L400 Psoriasis vulgaris: Secondary | ICD-10-CM | POA: Diagnosis not present

## 2018-08-12 DIAGNOSIS — L400 Psoriasis vulgaris: Secondary | ICD-10-CM | POA: Diagnosis not present

## 2018-08-16 DIAGNOSIS — L409 Psoriasis, unspecified: Secondary | ICD-10-CM | POA: Diagnosis not present

## 2018-08-19 DIAGNOSIS — L400 Psoriasis vulgaris: Secondary | ICD-10-CM | POA: Diagnosis not present

## 2018-08-23 DIAGNOSIS — L400 Psoriasis vulgaris: Secondary | ICD-10-CM | POA: Diagnosis not present

## 2018-08-26 DIAGNOSIS — L400 Psoriasis vulgaris: Secondary | ICD-10-CM | POA: Diagnosis not present

## 2018-08-30 DIAGNOSIS — L400 Psoriasis vulgaris: Secondary | ICD-10-CM | POA: Diagnosis not present

## 2018-09-02 DIAGNOSIS — L400 Psoriasis vulgaris: Secondary | ICD-10-CM | POA: Diagnosis not present

## 2018-09-06 DIAGNOSIS — L400 Psoriasis vulgaris: Secondary | ICD-10-CM | POA: Diagnosis not present

## 2018-09-09 ENCOUNTER — Other Ambulatory Visit: Payer: Self-pay | Admitting: Internal Medicine

## 2018-09-09 DIAGNOSIS — K5100 Ulcerative (chronic) pancolitis without complications: Secondary | ICD-10-CM | POA: Diagnosis not present

## 2018-09-09 DIAGNOSIS — L400 Psoriasis vulgaris: Secondary | ICD-10-CM | POA: Diagnosis not present

## 2018-09-09 DIAGNOSIS — K8301 Primary sclerosing cholangitis: Secondary | ICD-10-CM | POA: Diagnosis not present

## 2018-09-09 DIAGNOSIS — R978 Other abnormal tumor markers: Secondary | ICD-10-CM | POA: Diagnosis not present

## 2018-09-09 DIAGNOSIS — Z01818 Encounter for other preprocedural examination: Secondary | ICD-10-CM | POA: Diagnosis not present

## 2018-09-14 DIAGNOSIS — L400 Psoriasis vulgaris: Secondary | ICD-10-CM | POA: Diagnosis not present

## 2018-09-16 DIAGNOSIS — L400 Psoriasis vulgaris: Secondary | ICD-10-CM | POA: Diagnosis not present

## 2018-09-20 DIAGNOSIS — L400 Psoriasis vulgaris: Secondary | ICD-10-CM | POA: Diagnosis not present

## 2018-09-21 DIAGNOSIS — Z01812 Encounter for preprocedural laboratory examination: Secondary | ICD-10-CM | POA: Diagnosis not present

## 2018-09-21 DIAGNOSIS — Z20828 Contact with and (suspected) exposure to other viral communicable diseases: Secondary | ICD-10-CM | POA: Diagnosis not present

## 2018-09-23 DIAGNOSIS — L400 Psoriasis vulgaris: Secondary | ICD-10-CM | POA: Diagnosis not present

## 2018-09-24 DIAGNOSIS — Z880 Allergy status to penicillin: Secondary | ICD-10-CM | POA: Diagnosis not present

## 2018-09-24 DIAGNOSIS — Z87891 Personal history of nicotine dependence: Secondary | ICD-10-CM | POA: Diagnosis not present

## 2018-09-24 DIAGNOSIS — R978 Other abnormal tumor markers: Secondary | ICD-10-CM | POA: Diagnosis not present

## 2018-09-24 DIAGNOSIS — R932 Abnormal findings on diagnostic imaging of liver and biliary tract: Secondary | ICD-10-CM | POA: Diagnosis not present

## 2018-09-24 DIAGNOSIS — K831 Obstruction of bile duct: Secondary | ICD-10-CM | POA: Diagnosis not present

## 2018-09-24 DIAGNOSIS — I10 Essential (primary) hypertension: Secondary | ICD-10-CM | POA: Diagnosis not present

## 2018-09-24 DIAGNOSIS — K8020 Calculus of gallbladder without cholecystitis without obstruction: Secondary | ICD-10-CM | POA: Diagnosis not present

## 2018-09-24 DIAGNOSIS — R748 Abnormal levels of other serum enzymes: Secondary | ICD-10-CM | POA: Diagnosis not present

## 2018-09-24 DIAGNOSIS — Z85828 Personal history of other malignant neoplasm of skin: Secondary | ICD-10-CM | POA: Diagnosis not present

## 2018-09-24 DIAGNOSIS — K8051 Calculus of bile duct without cholangitis or cholecystitis with obstruction: Secondary | ICD-10-CM | POA: Diagnosis not present

## 2018-09-24 DIAGNOSIS — Z887 Allergy status to serum and vaccine status: Secondary | ICD-10-CM | POA: Diagnosis not present

## 2018-09-24 DIAGNOSIS — K8301 Primary sclerosing cholangitis: Secondary | ICD-10-CM | POA: Diagnosis not present

## 2018-09-27 DIAGNOSIS — L400 Psoriasis vulgaris: Secondary | ICD-10-CM | POA: Diagnosis not present

## 2018-09-30 DIAGNOSIS — R591 Generalized enlarged lymph nodes: Secondary | ICD-10-CM | POA: Diagnosis not present

## 2018-09-30 DIAGNOSIS — R918 Other nonspecific abnormal finding of lung field: Secondary | ICD-10-CM | POA: Diagnosis not present

## 2018-09-30 DIAGNOSIS — I771 Stricture of artery: Secondary | ICD-10-CM | POA: Diagnosis not present

## 2018-09-30 DIAGNOSIS — J90 Pleural effusion, not elsewhere classified: Secondary | ICD-10-CM | POA: Diagnosis not present

## 2018-09-30 DIAGNOSIS — I708 Atherosclerosis of other arteries: Secondary | ICD-10-CM | POA: Diagnosis not present

## 2018-09-30 DIAGNOSIS — R978 Other abnormal tumor markers: Secondary | ICD-10-CM | POA: Diagnosis not present

## 2018-09-30 DIAGNOSIS — K829 Disease of gallbladder, unspecified: Secondary | ICD-10-CM | POA: Diagnosis not present

## 2018-09-30 DIAGNOSIS — K769 Liver disease, unspecified: Secondary | ICD-10-CM | POA: Diagnosis not present

## 2018-09-30 DIAGNOSIS — C221 Intrahepatic bile duct carcinoma: Secondary | ICD-10-CM | POA: Diagnosis not present

## 2018-09-30 DIAGNOSIS — Z87891 Personal history of nicotine dependence: Secondary | ICD-10-CM | POA: Diagnosis not present

## 2018-09-30 DIAGNOSIS — R590 Localized enlarged lymph nodes: Secondary | ICD-10-CM | POA: Diagnosis not present

## 2018-09-30 DIAGNOSIS — K8301 Primary sclerosing cholangitis: Secondary | ICD-10-CM | POA: Diagnosis not present

## 2018-09-30 DIAGNOSIS — J479 Bronchiectasis, uncomplicated: Secondary | ICD-10-CM | POA: Diagnosis not present

## 2018-10-04 DIAGNOSIS — L400 Psoriasis vulgaris: Secondary | ICD-10-CM | POA: Diagnosis not present

## 2018-10-06 DIAGNOSIS — K769 Liver disease, unspecified: Secondary | ICD-10-CM | POA: Diagnosis not present

## 2018-10-06 DIAGNOSIS — C227 Other specified carcinomas of liver: Secondary | ICD-10-CM | POA: Diagnosis not present

## 2018-10-06 DIAGNOSIS — K8301 Primary sclerosing cholangitis: Secondary | ICD-10-CM | POA: Diagnosis not present

## 2018-10-06 DIAGNOSIS — R160 Hepatomegaly, not elsewhere classified: Secondary | ICD-10-CM | POA: Diagnosis not present

## 2018-10-07 DIAGNOSIS — L400 Psoriasis vulgaris: Secondary | ICD-10-CM | POA: Diagnosis not present

## 2018-10-11 DIAGNOSIS — L400 Psoriasis vulgaris: Secondary | ICD-10-CM | POA: Diagnosis not present

## 2018-10-12 DIAGNOSIS — Z87891 Personal history of nicotine dependence: Secondary | ICD-10-CM | POA: Diagnosis not present

## 2018-10-12 DIAGNOSIS — Z801 Family history of malignant neoplasm of trachea, bronchus and lung: Secondary | ICD-10-CM | POA: Diagnosis not present

## 2018-10-12 DIAGNOSIS — C221 Intrahepatic bile duct carcinoma: Secondary | ICD-10-CM | POA: Insufficient documentation

## 2018-10-12 DIAGNOSIS — Z23 Encounter for immunization: Secondary | ICD-10-CM | POA: Diagnosis not present

## 2018-10-12 DIAGNOSIS — R591 Generalized enlarged lymph nodes: Secondary | ICD-10-CM | POA: Diagnosis not present

## 2018-10-12 DIAGNOSIS — Z808 Family history of malignant neoplasm of other organs or systems: Secondary | ICD-10-CM | POA: Diagnosis not present

## 2018-10-14 DIAGNOSIS — C221 Intrahepatic bile duct carcinoma: Secondary | ICD-10-CM | POA: Diagnosis not present

## 2018-10-14 DIAGNOSIS — Z452 Encounter for adjustment and management of vascular access device: Secondary | ICD-10-CM | POA: Diagnosis not present

## 2018-10-18 DIAGNOSIS — C221 Intrahepatic bile duct carcinoma: Secondary | ICD-10-CM | POA: Diagnosis not present

## 2018-10-18 DIAGNOSIS — L400 Psoriasis vulgaris: Secondary | ICD-10-CM | POA: Diagnosis not present

## 2018-10-19 DIAGNOSIS — R160 Hepatomegaly, not elsewhere classified: Secondary | ICD-10-CM | POA: Diagnosis not present

## 2018-10-19 DIAGNOSIS — Z87891 Personal history of nicotine dependence: Secondary | ICD-10-CM | POA: Diagnosis not present

## 2018-10-19 DIAGNOSIS — Z5111 Encounter for antineoplastic chemotherapy: Secondary | ICD-10-CM | POA: Diagnosis not present

## 2018-10-19 DIAGNOSIS — C221 Intrahepatic bile duct carcinoma: Secondary | ICD-10-CM | POA: Diagnosis not present

## 2018-10-26 DIAGNOSIS — C221 Intrahepatic bile duct carcinoma: Secondary | ICD-10-CM | POA: Diagnosis not present

## 2018-10-26 DIAGNOSIS — Z87891 Personal history of nicotine dependence: Secondary | ICD-10-CM | POA: Diagnosis not present

## 2018-10-26 DIAGNOSIS — Z5111 Encounter for antineoplastic chemotherapy: Secondary | ICD-10-CM | POA: Diagnosis not present

## 2018-11-02 DIAGNOSIS — H2513 Age-related nuclear cataract, bilateral: Secondary | ICD-10-CM | POA: Diagnosis not present

## 2018-11-02 DIAGNOSIS — H52203 Unspecified astigmatism, bilateral: Secondary | ICD-10-CM | POA: Diagnosis not present

## 2018-11-02 DIAGNOSIS — H524 Presbyopia: Secondary | ICD-10-CM | POA: Diagnosis not present

## 2018-11-04 ENCOUNTER — Encounter: Payer: Self-pay | Admitting: Gastroenterology

## 2018-11-04 ENCOUNTER — Ambulatory Visit (INDEPENDENT_AMBULATORY_CARE_PROVIDER_SITE_OTHER): Payer: Medicare Other | Admitting: Gastroenterology

## 2018-11-04 ENCOUNTER — Other Ambulatory Visit: Payer: Self-pay

## 2018-11-04 VITALS — BP 108/72 | HR 87 | Temp 98.1°F | Ht 72.0 in | Wt 205.0 lb

## 2018-11-04 DIAGNOSIS — Z860100 Personal history of colon polyps, unspecified: Secondary | ICD-10-CM

## 2018-11-04 DIAGNOSIS — C221 Intrahepatic bile duct carcinoma: Secondary | ICD-10-CM | POA: Diagnosis not present

## 2018-11-04 DIAGNOSIS — K50119 Crohn's disease of large intestine with unspecified complications: Secondary | ICD-10-CM | POA: Diagnosis not present

## 2018-11-04 DIAGNOSIS — K8301 Primary sclerosing cholangitis: Secondary | ICD-10-CM | POA: Diagnosis not present

## 2018-11-04 MED ORDER — MESALAMINE 1.2 G PO TBEC: 2.4000 g | DELAYED_RELEASE_TABLET | Freq: Every day | ORAL | 3 refills | Status: AC

## 2018-11-04 NOTE — Patient Instructions (Addendum)
If you are age 71 or older, your body mass index should be between 23-30. Your Body mass index is 27.8 kg/m. If this is out of the aforementioned range listed, please consider follow up with your Primary Care Provider.  If you are age 53 or younger, your body mass index should be between 19-25. Your Body mass index is 27.8 kg/m. If this is out of the aformentioned range listed, please consider follow up with your Primary Care Provider.   To help prevent the possible spread of infection to our patients, communities, and staff; we will be implementing the following measures:  As of now we are not allowing any visitors/family members to accompany you to any upcoming appointments with [facility]. If you have any concerns about this please contact our office to discuss prior to the appointment.   We have sent the following medications to your pharmacy for you to pick up at your convenience: Lialda 1.2g:  Take two tablets daily  Thank you for entrusting me with your care and for choosing [facility], Dr.  Cellar

## 2018-11-04 NOTE — Progress Notes (Signed)
HPI :  Crohn's / [place] history: Diagnosed with Crohns colitis in 1999 per Dr. Olevia Perches (thought Crohns due to appearance and ? Rectal sparing in the past?), also with history of PSC. He thinks he was diagnosed with [place] around 12years ago or so. He had been followed at [facility] for [facility], Dr. Damita Lack.It was recommended that he have a colonoscopy and MRI of his liver every year or so.   Regarding his colitis history, was initially on Asacol, and then switched to Lialda. He has never been on Remicade or Humira, or other anti-TNFs. He reports a history of a colitis flare in 1999, his last hospitalization. 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   Last colonoscopy: 02/12/18  SINCE LAST VISIT  73year old male here for follow-up visit for [place] Crohn's colitis.  He is here for a follow-up visit.  [facility] for rising liver enzymes and CA 19-9 levels.  There is been underlying concern for cholangiocarcinoma however underwent multiple ERCPs / MRCP that did not show any mass lesions / malignancy in recent months.  Most recently CA-19-9 rose to 51,227. He underwent ERCP on 09/24/2018 showing multiple mild diffuse biliary strictures were found in the left and right hepatic ducts and all intrahepatic branches. There was no single dominant stenosis. Diffuse irregularities were found in the left main hepatic duct and intrahepatic branches There were no intervenable lesions within the left hepatic duct system. Common bile duct and the right main hepatic duct were successfully dilated. Cells for cytology obtained in the right main hepatic duct.  CT scan then done on 09/30/18 which showed infiltrating soft tissue mass centered in the hepatic hilum and extending  along the proper and common hepatic arteries, obliterating the left portal vein and resulting in high-grade stenosis of the left hepatic artery. Heterogenous masslike appearance of the left hepatic lobe concerning for intrahepatic extension of the mass. 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 Bulky porta hepatis lymphadenopathy is concerning for metastatic disease.  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 [facility] The patient is now on gemcitabine and cisplatin for treatment. He just started therapy. Labs last week on 10/20 show Hgb of 12.5, plt 188, WBC of 6.2. AP 753, ALT 247, AST 122, T bil 2.9.   Generally he is feeling pretty well all things considered since he started chemo.  His energy is down and his appetite is a bit lower however he otherwise feels okay.  He denies any abdominal pains at all.  No jaundice.  No fevers.  He has lost considerable weight since July, about 30 pounds.  He is able to eat and drinks boost/Ensure to improve his caloric intake.  He states his bowels are fine, his colitis is not active, he takes Lialda which controls his symptoms.  He is using Tums as needed for heartburn, no other issues that really bother him.  Since of last seen him he had a colonoscopy in February 2020 as below  Colonoscopy 02/12/18 - Erythematous mucosa in the terminal ileum. Biopsied. - Two 3 to 7 mm polyps in the cecum, removed with a cold snare. Resected and retrieved. - One 10 to 15 mm polyp in the ascending  colon, removed piecemeal using a cold snare. Resected and retrieved. - Two 3 to 4 mm polyps in the rectum, removed with a cold snare. Resected and retrieved. - Diverticulosis in the sigmoid colon. - Pseudopolyps in the sigmoid colon, in the descending colon, at the splenic flexure, in the transverse colon, at the hepatic flexure and in the ascending colon. Surveillance biopsies obtained. - Internal  hemorrhoids. - The examination was otherwise normal.  Path shows traditional serrated adenoma, sessile serrated polyps, hyperplastic polyps Benign ileal biopsies Focal minimally active colitis in right and transverse colon, no inflammation in left colon     Past Medical History:  Diagnosis Date   Abnormal LFTs    Benign prostatic hypertrophy    Cancer (HCC)    skin cancer on face   Cataract    no surgery   Cholelithiasis    asymptomatic   Crohn disease (HCC)    Hemolytic anemia (HCC)    Hyperglycemia 05/03/2015   Hypertension    Kidney stones    Kidney stones   Psoriasis    Sclerosing cholangitis      Past Surgical History:  Procedure Laterality Date   CYST REMOVAL WITH BONE GRAFT     right hand   Dental Implant     ERCP  12/2017   At [facility] per pt   HAMMER TOE SURGERY     right foot   HAND SURGERY     cyst removal- left hand secondary to broken finger   LUMBAR LAMINECTOMY  1980's   STAPEDECTOMY     Family History  Problem Relation Age of Onset   Thyroid cancer Mother    Lung cancer Father    Breast cancer Other        aunt   Lung cancer Other        aunt   Clotting disorder Other        uncle   Diabetes Brother    Bladder Cancer Brother    Gallbladder disease Brother    Other Brother    Colon cancer Neg Hx    Rectal cancer Neg Hx    Social History   Tobacco Use   Smoking status: Former Smoker    Quit date: 01/06/1997    Years since quitting: 21.8   Smokeless tobacco: Never Used  Substance Use Topics   Alcohol use: Yes    Alcohol/week: 21.0 standard drinks    Types: 21 Cans of beer per week   Drug use: No   Current Outpatient Medications  Medication Sig Dispense Refill   Ascorbic Acid (VITAMIN C PO) Take 1 capsule by mouth daily.       aspirin (ASPIRIN ADULT LOW STRENGTH) 81 MG EC tablet TAKE 1 TABLET BY MOUTH EVERY DAY 90 tablet 11   b complex vitamins tablet Take 1 tablet by mouth daily.       Calcium  Carbonate-Vitamin D (CALCIUM + D PO) Take 1 capsule by mouth 2 (two) times daily.       Iodoquinol-HC (HYDROCORTISONE-IODOQUINOL) 1-1 % CREA as needed. For psoriasis  3   mesalamine (LIALDA) 1.2 g EC tablet Take 2 tablets (2.4 g total) by mouth daily with breakfast. 180 tablet 3   Multiple Vitamins-Minerals (CENTRUM PO) Take 1 tablet by mouth daily.       ondansetron (ZOFRAN) 8 MG tablet TAKE 1 TABLET BY MOUTH EVERY 12 HOURS AS NEEDED FOR NAUSEA OR VOMITING     prochlorperazine (COMPAZINE) 10 MG tablet TAKE 1 TABLET (10 MG  TOTAL) BY MOUTH EVERY 6 (SIX) HOURS AS NEEDED FOR NAUSEA     telmisartan (MICARDIS) 80 MG tablet TAKE 1 TABLET BY MOUTH EVERY DAY 90 tablet 3   triamcinolone cream (KENALOG) 0.1 % APPLY ON THE SKIN TWICE A DAY  2   VITAMIN A PO Take 1 capsule by mouth daily.      No current facility-administered medications for this visit.    Allergies  Allergen Reactions   Penicillins Hives    REACTION: hives   Typhoid Vaccines Rash and Other (See Comments)    "fever"     Review of Systems: All systems reviewed and negative except where noted in HPI.    As above  Physical Exam: BP 108/72 (BP Location: Left Arm, Patient Position: Sitting, Cuff Size: Normal)    Pulse 87    Temp 98.1 F (36.7 C) (Oral)    Ht 6' (1.829 m)    Wt 205 lb (93 kg)    BMI 27.80 kg/m  Constitutional: Pleasant,well-developed, male in no acute distress. HEENT: Normocephalic and atraumatic. Conjunctivae are normal. No scleral icterus. Neck supple.  Cardiovascular: Normal rate, regular rhythm.  Pulmonary/chest: Effort normal and breath sounds normal. No wheezing, rales or rhonchi. Abdominal: Soft, nondistended, nontender. There are no masses palpable. No hepatomegaly. Extremities: no edema Lymphadenopathy: No cervical adenopathy noted. Neurological: Alert and oriented to person place and time. Skin: Skin is warm and dry. No rashes noted. Psychiatric: Normal mood and affect. Behavior is  normal.   ASSESSMENT AND PLAN: 73 year old male here for reassessment the following issues:  PSC / Cholangiocarcinoma - unfortunately the patient has had a relatively recent diagnosis of cholangiocarcinoma.  He is not a surgical candidate.  He is being treated with chemotherapy at [facility] and so far appears to be tolerating it well.  We discussed his course at length.  He is going to continue with chemotherapy, and have a CT scan after few cycles to assess disease response.  He will be mindful if he develops a fever or complications from chemotherapy he needs to contact us or his doctors at [facility].  If he needs labs drawn locally or have his imaging done locally we can do that here and send results to his physicians at [facility] to save him the drive there.  He will let me know if we can assist him in any way during this process.  He otherwise has no specific complaints other than some weakness and poor appetite.  If the appetite gets worse and needs something to help with that, may consider Remeron.  He can continue antiemetics as needed.  He will let me know if he needs anything from our end as he goes through this process.  Crohn's colitis - on Lialda, good control of disease for the most part.  We discussed now that he is on chemotherapy I will help keep his disease in control as well.  He has historically not participated in routine surveillance colonoscopy as recommended, but he is up-to-date however this year with multiple polyps removed, however no high risk lesions.  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  He would normally next be due for surveillance next February, we will hold off on that for now as he is going through his cholangiocarcinoma therapy, and await his course with this.  Refill Lialda for him, he can contact me with any issues in this regard moving forward.  All questions answered  Ord Cellar, MD [facility]

## 2018-11-09 DIAGNOSIS — C221 Intrahepatic bile duct carcinoma: Secondary | ICD-10-CM | POA: Diagnosis not present

## 2018-11-09 DIAGNOSIS — Z87891 Personal history of nicotine dependence: Secondary | ICD-10-CM | POA: Diagnosis not present

## 2018-11-09 DIAGNOSIS — Z5111 Encounter for antineoplastic chemotherapy: Secondary | ICD-10-CM | POA: Diagnosis not present

## 2018-11-10 ENCOUNTER — Encounter: Payer: Self-pay | Admitting: Internal Medicine

## 2018-11-10 ENCOUNTER — Ambulatory Visit (INDEPENDENT_AMBULATORY_CARE_PROVIDER_SITE_OTHER): Payer: Medicare Other | Admitting: Internal Medicine

## 2018-11-10 DIAGNOSIS — C221 Intrahepatic bile duct carcinoma: Secondary | ICD-10-CM

## 2018-11-10 DIAGNOSIS — I10 Essential (primary) hypertension: Secondary | ICD-10-CM | POA: Diagnosis not present

## 2018-11-10 DIAGNOSIS — R739 Hyperglycemia, unspecified: Secondary | ICD-10-CM | POA: Diagnosis not present

## 2018-11-10 NOTE — Assessment & Plan Note (Signed)
With recent wt loss, has ongoing CMT at [facility] now finished 3rd tx,  to f/u any worsening symptoms or concerns

## 2018-11-10 NOTE — Assessment & Plan Note (Signed)
BP now normalized with wt loss - ok to d/c telmisartan and cont to follow at home and next visit

## 2018-11-10 NOTE — Patient Instructions (Signed)
Ok to stop the telmisartan  Please continue all other medications as before, and refills have been done if requested.  Please have the pharmacy call with any other refills you may need.  Please keep your appointments with your specialists as you may have planned

## 2018-11-10 NOTE — Progress Notes (Signed)
Patient ID: Mark [name], male   DOB: May 04, 1945, 73 y.o.   MRN: [id number]  Phone visit  Cumulative time during 7-day interval 13 min, there was not an associated office visit for this concern within a 7 day period.  Verbal consent for services obtained from patient prior to services given.  Names of all persons present for services: Cathlean Cower, MD, patient  Chief complaint: HTN  History, background, results pertinent:  Here to f/u; overall doing ok,  Pt denies chest pain, increasing sob or doe, wheezing, orthopnea, PND, increased LE swelling, palpitations, dizziness or syncope, but had several low BPs with recent wt loss related to sept 2020 malignancy.  Pt had to quit the micardix x 3 days, and BP < 140/90 at [facility] yesterday.  Pt denies new neurological symptoms such as new headache, or facial or extremity weakness or numbness.  Pt denies polydipsia, polyuria, or low sugar episode.  Wt is overall down from 238 to 201 at home. Past Medical History:  Diagnosis Date  . Abnormal LFTs   . Benign prostatic hypertrophy   . Cancer ([place])    skin cancer on face  . Cataract    no surgery  . Cholelithiasis    asymptomatic  . Crohn disease ([place])   . Hemolytic anemia ([place])   . Hyperglycemia 05/03/2015  . Hypertension   . Kidney stones    Kidney stones  . Psoriasis   . Sclerosing cholangitis    No results found for this or any previous visit (from the past 81 hour(s)).  Lab Results  Component Value Date   WBC 8.8 11/09/2017   HGB 13.9 11/09/2017   HCT 40.0 11/09/2017   PLT 325.0 11/09/2017   GLUCOSE 83 04/07/2017   CHOL 262 (H) 04/07/2017   TRIG 83.0 04/07/2017   HDL 79.70 04/07/2017   LDLCALC 166 (H) 04/07/2017   ALT 137 (H) 06/28/2018   AST 119 (H) 06/28/2018   NA 136 04/07/2017   K 4.4 04/07/2017   CL 100 04/07/2017   CREATININE 0.71 04/07/2017   BUN 10 04/07/2017   CO2 30 04/07/2017   TSH 1.14 04/07/2017   PSA 1.62 04/07/2017   HGBA1C 5.1 04/07/2017     A/P/next  steps:   See notes A/P  Cathlean Cower MD

## 2018-11-10 NOTE — Assessment & Plan Note (Signed)
stable overall by history and exam, recent data reviewed with pt, and pt to continue medical treatment as before,  to f/u any worsening symptoms or concerns  

## 2018-11-16 DIAGNOSIS — Z5111 Encounter for antineoplastic chemotherapy: Secondary | ICD-10-CM | POA: Diagnosis not present

## 2018-11-16 DIAGNOSIS — C221 Intrahepatic bile duct carcinoma: Secondary | ICD-10-CM | POA: Diagnosis not present

## 2018-11-30 DIAGNOSIS — C221 Intrahepatic bile duct carcinoma: Secondary | ICD-10-CM | POA: Diagnosis not present

## 2018-11-30 DIAGNOSIS — Z5111 Encounter for antineoplastic chemotherapy: Secondary | ICD-10-CM | POA: Diagnosis not present

## 2018-11-30 DIAGNOSIS — Z87891 Personal history of nicotine dependence: Secondary | ICD-10-CM | POA: Diagnosis not present

## 2018-12-07 DIAGNOSIS — Z5111 Encounter for antineoplastic chemotherapy: Secondary | ICD-10-CM | POA: Diagnosis not present

## 2018-12-07 DIAGNOSIS — C221 Intrahepatic bile duct carcinoma: Secondary | ICD-10-CM | POA: Diagnosis not present

## 2018-12-21 DIAGNOSIS — J90 Pleural effusion, not elsewhere classified: Secondary | ICD-10-CM | POA: Diagnosis not present

## 2018-12-21 DIAGNOSIS — K8301 Primary sclerosing cholangitis: Secondary | ICD-10-CM | POA: Diagnosis not present

## 2018-12-21 DIAGNOSIS — C258 Malignant neoplasm of overlapping sites of pancreas: Secondary | ICD-10-CM | POA: Diagnosis not present

## 2018-12-21 DIAGNOSIS — Z8505 Personal history of malignant neoplasm of liver: Secondary | ICD-10-CM | POA: Diagnosis not present

## 2018-12-21 DIAGNOSIS — E871 Hypo-osmolality and hyponatremia: Secondary | ICD-10-CM | POA: Diagnosis not present

## 2018-12-21 DIAGNOSIS — R918 Other nonspecific abnormal finding of lung field: Secondary | ICD-10-CM | POA: Diagnosis not present

## 2018-12-21 DIAGNOSIS — R188 Other ascites: Secondary | ICD-10-CM | POA: Diagnosis not present

## 2018-12-21 DIAGNOSIS — C221 Intrahepatic bile duct carcinoma: Secondary | ICD-10-CM | POA: Diagnosis not present

## 2018-12-21 DIAGNOSIS — J189 Pneumonia, unspecified organism: Secondary | ICD-10-CM | POA: Diagnosis not present

## 2018-12-21 DIAGNOSIS — C229 Malignant neoplasm of liver, not specified as primary or secondary: Secondary | ICD-10-CM | POA: Diagnosis not present

## 2018-12-21 DIAGNOSIS — I81 Portal vein thrombosis: Secondary | ICD-10-CM | POA: Diagnosis not present

## 2018-12-21 DIAGNOSIS — J9601 Acute respiratory failure with hypoxia: Secondary | ICD-10-CM | POA: Diagnosis not present

## 2018-12-21 DIAGNOSIS — E43 Unspecified severe protein-calorie malnutrition: Secondary | ICD-10-CM | POA: Diagnosis not present

## 2018-12-22 DIAGNOSIS — J189 Pneumonia, unspecified organism: Secondary | ICD-10-CM | POA: Diagnosis present

## 2018-12-22 DIAGNOSIS — Z66 Do not resuscitate: Secondary | ICD-10-CM | POA: Diagnosis present

## 2018-12-22 DIAGNOSIS — J90 Pleural effusion, not elsewhere classified: Secondary | ICD-10-CM | POA: Diagnosis present

## 2018-12-22 DIAGNOSIS — C258 Malignant neoplasm of overlapping sites of pancreas: Secondary | ICD-10-CM | POA: Diagnosis not present

## 2018-12-22 DIAGNOSIS — R64 Cachexia: Secondary | ICD-10-CM | POA: Diagnosis present

## 2018-12-22 DIAGNOSIS — Z79899 Other long term (current) drug therapy: Secondary | ICD-10-CM | POA: Diagnosis not present

## 2018-12-22 DIAGNOSIS — C786 Secondary malignant neoplasm of retroperitoneum and peritoneum: Secondary | ICD-10-CM | POA: Diagnosis present

## 2018-12-22 DIAGNOSIS — I81 Portal vein thrombosis: Secondary | ICD-10-CM | POA: Diagnosis not present

## 2018-12-22 DIAGNOSIS — K5000 Crohn's disease of small intestine without complications: Secondary | ICD-10-CM | POA: Diagnosis not present

## 2018-12-22 DIAGNOSIS — C229 Malignant neoplasm of liver, not specified as primary or secondary: Secondary | ICD-10-CM | POA: Diagnosis not present

## 2018-12-22 DIAGNOSIS — I2510 Atherosclerotic heart disease of native coronary artery without angina pectoris: Secondary | ICD-10-CM | POA: Diagnosis present

## 2018-12-22 DIAGNOSIS — Z87891 Personal history of nicotine dependence: Secondary | ICD-10-CM | POA: Diagnosis not present

## 2018-12-22 DIAGNOSIS — E43 Unspecified severe protein-calorie malnutrition: Secondary | ICD-10-CM | POA: Diagnosis present

## 2018-12-22 DIAGNOSIS — Z887 Allergy status to serum and vaccine status: Secondary | ICD-10-CM | POA: Diagnosis not present

## 2018-12-22 DIAGNOSIS — K766 Portal hypertension: Secondary | ICD-10-CM | POA: Diagnosis present

## 2018-12-22 DIAGNOSIS — Z20828 Contact with and (suspected) exposure to other viral communicable diseases: Secondary | ICD-10-CM | POA: Diagnosis present

## 2018-12-22 DIAGNOSIS — C221 Intrahepatic bile duct carcinoma: Secondary | ICD-10-CM | POA: Diagnosis not present

## 2018-12-22 DIAGNOSIS — I10 Essential (primary) hypertension: Secondary | ICD-10-CM | POA: Diagnosis present

## 2018-12-22 DIAGNOSIS — Z9981 Dependence on supplemental oxygen: Secondary | ICD-10-CM | POA: Diagnosis not present

## 2018-12-22 DIAGNOSIS — C801 Malignant (primary) neoplasm, unspecified: Secondary | ICD-10-CM | POA: Diagnosis not present

## 2018-12-22 DIAGNOSIS — R0602 Shortness of breath: Secondary | ICD-10-CM | POA: Diagnosis not present

## 2018-12-22 DIAGNOSIS — R599 Enlarged lymph nodes, unspecified: Secondary | ICD-10-CM | POA: Diagnosis present

## 2018-12-22 DIAGNOSIS — R069 Unspecified abnormalities of breathing: Secondary | ICD-10-CM | POA: Diagnosis not present

## 2018-12-22 DIAGNOSIS — Z880 Allergy status to penicillin: Secondary | ICD-10-CM | POA: Diagnosis not present

## 2018-12-22 DIAGNOSIS — E871 Hypo-osmolality and hyponatremia: Secondary | ICD-10-CM | POA: Diagnosis present

## 2018-12-22 DIAGNOSIS — J9601 Acute respiratory failure with hypoxia: Secondary | ICD-10-CM | POA: Diagnosis present

## 2018-12-22 DIAGNOSIS — Z9221 Personal history of antineoplastic chemotherapy: Secondary | ICD-10-CM | POA: Diagnosis not present

## 2018-12-22 DIAGNOSIS — R32 Unspecified urinary incontinence: Secondary | ICD-10-CM | POA: Diagnosis not present

## 2018-12-22 DIAGNOSIS — K5090 Crohn's disease, unspecified, without complications: Secondary | ICD-10-CM | POA: Diagnosis present

## 2018-12-22 DIAGNOSIS — Z9181 History of falling: Secondary | ICD-10-CM | POA: Diagnosis not present

## 2018-12-22 DIAGNOSIS — Z515 Encounter for palliative care: Secondary | ICD-10-CM | POA: Diagnosis not present

## 2018-12-22 DIAGNOSIS — R188 Other ascites: Secondary | ICD-10-CM | POA: Diagnosis not present

## 2018-12-22 DIAGNOSIS — Z7982 Long term (current) use of aspirin: Secondary | ICD-10-CM | POA: Diagnosis not present

## 2018-12-22 DIAGNOSIS — K8301 Primary sclerosing cholangitis: Secondary | ICD-10-CM | POA: Diagnosis not present

## 2018-12-22 DIAGNOSIS — Z6827 Body mass index (BMI) 27.0-27.9, adult: Secondary | ICD-10-CM | POA: Diagnosis not present

## 2018-12-22 DIAGNOSIS — K589 Irritable bowel syndrome without diarrhea: Secondary | ICD-10-CM | POA: Diagnosis not present

## 2018-12-29 DIAGNOSIS — Z66 Do not resuscitate: Secondary | ICD-10-CM | POA: Diagnosis not present

## 2018-12-29 DIAGNOSIS — R32 Unspecified urinary incontinence: Secondary | ICD-10-CM | POA: Diagnosis not present

## 2018-12-29 DIAGNOSIS — Z9181 History of falling: Secondary | ICD-10-CM | POA: Diagnosis not present

## 2018-12-29 DIAGNOSIS — Z515 Encounter for palliative care: Secondary | ICD-10-CM | POA: Diagnosis not present

## 2018-12-29 DIAGNOSIS — C221 Intrahepatic bile duct carcinoma: Secondary | ICD-10-CM | POA: Diagnosis not present

## 2018-12-29 DIAGNOSIS — R069 Unspecified abnormalities of breathing: Secondary | ICD-10-CM | POA: Diagnosis not present

## 2018-12-29 DIAGNOSIS — C801 Malignant (primary) neoplasm, unspecified: Secondary | ICD-10-CM | POA: Diagnosis not present

## 2018-12-29 DIAGNOSIS — I10 Essential (primary) hypertension: Secondary | ICD-10-CM | POA: Diagnosis not present

## 2018-12-29 DIAGNOSIS — R0602 Shortness of breath: Secondary | ICD-10-CM | POA: Diagnosis not present

## 2018-12-29 DIAGNOSIS — K5000 Crohn's disease of small intestine without complications: Secondary | ICD-10-CM | POA: Diagnosis not present

## 2018-12-29 DIAGNOSIS — Z9981 Dependence on supplemental oxygen: Secondary | ICD-10-CM | POA: Diagnosis not present

## 2018-12-29 DIAGNOSIS — K589 Irritable bowel syndrome without diarrhea: Secondary | ICD-10-CM | POA: Diagnosis not present

## 2018-12-30 DIAGNOSIS — K589 Irritable bowel syndrome without diarrhea: Secondary | ICD-10-CM | POA: Diagnosis not present

## 2018-12-30 DIAGNOSIS — C221 Intrahepatic bile duct carcinoma: Secondary | ICD-10-CM | POA: Diagnosis not present

## 2018-12-30 DIAGNOSIS — Z9981 Dependence on supplemental oxygen: Secondary | ICD-10-CM | POA: Diagnosis not present

## 2018-12-30 DIAGNOSIS — I10 Essential (primary) hypertension: Secondary | ICD-10-CM | POA: Diagnosis not present

## 2018-12-30 DIAGNOSIS — R32 Unspecified urinary incontinence: Secondary | ICD-10-CM | POA: Diagnosis not present

## 2018-12-30 DIAGNOSIS — K5000 Crohn's disease of small intestine without complications: Secondary | ICD-10-CM | POA: Diagnosis not present

## 2019-01-03 DIAGNOSIS — K5000 Crohn's disease of small intestine without complications: Secondary | ICD-10-CM | POA: Diagnosis not present

## 2019-01-03 DIAGNOSIS — R32 Unspecified urinary incontinence: Secondary | ICD-10-CM | POA: Diagnosis not present

## 2019-01-03 DIAGNOSIS — I10 Essential (primary) hypertension: Secondary | ICD-10-CM | POA: Diagnosis not present

## 2019-01-03 DIAGNOSIS — Z9981 Dependence on supplemental oxygen: Secondary | ICD-10-CM | POA: Diagnosis not present

## 2019-01-03 DIAGNOSIS — C221 Intrahepatic bile duct carcinoma: Secondary | ICD-10-CM | POA: Diagnosis not present

## 2019-01-03 DIAGNOSIS — K589 Irritable bowel syndrome without diarrhea: Secondary | ICD-10-CM | POA: Diagnosis not present

## 2019-01-04 DIAGNOSIS — K589 Irritable bowel syndrome without diarrhea: Secondary | ICD-10-CM | POA: Diagnosis not present

## 2019-01-04 DIAGNOSIS — C221 Intrahepatic bile duct carcinoma: Secondary | ICD-10-CM | POA: Diagnosis not present

## 2019-01-04 DIAGNOSIS — K5000 Crohn's disease of small intestine without complications: Secondary | ICD-10-CM | POA: Diagnosis not present

## 2019-01-04 DIAGNOSIS — Z9981 Dependence on supplemental oxygen: Secondary | ICD-10-CM | POA: Diagnosis not present

## 2019-01-04 DIAGNOSIS — R32 Unspecified urinary incontinence: Secondary | ICD-10-CM | POA: Diagnosis not present

## 2019-01-04 DIAGNOSIS — I10 Essential (primary) hypertension: Secondary | ICD-10-CM | POA: Diagnosis not present

## 2019-01-06 DIAGNOSIS — K589 Irritable bowel syndrome without diarrhea: Secondary | ICD-10-CM | POA: Diagnosis not present

## 2019-01-06 DIAGNOSIS — I10 Essential (primary) hypertension: Secondary | ICD-10-CM | POA: Diagnosis not present

## 2019-01-06 DIAGNOSIS — R32 Unspecified urinary incontinence: Secondary | ICD-10-CM | POA: Diagnosis not present

## 2019-01-06 DIAGNOSIS — K5000 Crohn's disease of small intestine without complications: Secondary | ICD-10-CM | POA: Diagnosis not present

## 2019-01-06 DIAGNOSIS — Z9981 Dependence on supplemental oxygen: Secondary | ICD-10-CM | POA: Diagnosis not present

## 2019-01-06 DIAGNOSIS — C221 Intrahepatic bile duct carcinoma: Secondary | ICD-10-CM | POA: Diagnosis not present

## 2019-01-07 DIAGNOSIS — Z66 Do not resuscitate: Secondary | ICD-10-CM | POA: Diagnosis not present

## 2019-01-07 DIAGNOSIS — C221 Intrahepatic bile duct carcinoma: Secondary | ICD-10-CM | POA: Diagnosis not present

## 2019-01-07 DIAGNOSIS — K589 Irritable bowel syndrome without diarrhea: Secondary | ICD-10-CM | POA: Diagnosis not present

## 2019-01-07 DIAGNOSIS — R32 Unspecified urinary incontinence: Secondary | ICD-10-CM | POA: Diagnosis not present

## 2019-01-07 DIAGNOSIS — I10 Essential (primary) hypertension: Secondary | ICD-10-CM | POA: Diagnosis not present

## 2019-01-07 DIAGNOSIS — Z9981 Dependence on supplemental oxygen: Secondary | ICD-10-CM | POA: Diagnosis not present

## 2019-01-07 DIAGNOSIS — K5000 Crohn's disease of small intestine without complications: Secondary | ICD-10-CM | POA: Diagnosis not present

## 2019-01-07 DIAGNOSIS — Z515 Encounter for palliative care: Secondary | ICD-10-CM | POA: Diagnosis not present

## 2019-01-10 DIAGNOSIS — K5000 Crohn's disease of small intestine without complications: Secondary | ICD-10-CM | POA: Diagnosis not present

## 2019-01-10 DIAGNOSIS — C221 Intrahepatic bile duct carcinoma: Secondary | ICD-10-CM | POA: Diagnosis not present

## 2019-01-10 DIAGNOSIS — I10 Essential (primary) hypertension: Secondary | ICD-10-CM | POA: Diagnosis not present

## 2019-01-10 DIAGNOSIS — K589 Irritable bowel syndrome without diarrhea: Secondary | ICD-10-CM | POA: Diagnosis not present

## 2019-01-10 DIAGNOSIS — R32 Unspecified urinary incontinence: Secondary | ICD-10-CM | POA: Diagnosis not present

## 2019-01-10 DIAGNOSIS — Z9981 Dependence on supplemental oxygen: Secondary | ICD-10-CM | POA: Diagnosis not present

## 2019-01-11 DIAGNOSIS — R32 Unspecified urinary incontinence: Secondary | ICD-10-CM | POA: Diagnosis not present

## 2019-01-11 DIAGNOSIS — K5000 Crohn's disease of small intestine without complications: Secondary | ICD-10-CM | POA: Diagnosis not present

## 2019-01-11 DIAGNOSIS — C221 Intrahepatic bile duct carcinoma: Secondary | ICD-10-CM | POA: Diagnosis not present

## 2019-01-11 DIAGNOSIS — I10 Essential (primary) hypertension: Secondary | ICD-10-CM | POA: Diagnosis not present

## 2019-01-11 DIAGNOSIS — Z9981 Dependence on supplemental oxygen: Secondary | ICD-10-CM | POA: Diagnosis not present

## 2019-01-11 DIAGNOSIS — K589 Irritable bowel syndrome without diarrhea: Secondary | ICD-10-CM | POA: Diagnosis not present

## 2019-01-12 DIAGNOSIS — C221 Intrahepatic bile duct carcinoma: Secondary | ICD-10-CM | POA: Diagnosis not present

## 2019-01-12 DIAGNOSIS — K5000 Crohn's disease of small intestine without complications: Secondary | ICD-10-CM | POA: Diagnosis not present

## 2019-01-12 DIAGNOSIS — R32 Unspecified urinary incontinence: Secondary | ICD-10-CM | POA: Diagnosis not present

## 2019-01-12 DIAGNOSIS — Z9981 Dependence on supplemental oxygen: Secondary | ICD-10-CM | POA: Diagnosis not present

## 2019-01-12 DIAGNOSIS — K589 Irritable bowel syndrome without diarrhea: Secondary | ICD-10-CM | POA: Diagnosis not present

## 2019-01-12 DIAGNOSIS — I10 Essential (primary) hypertension: Secondary | ICD-10-CM | POA: Diagnosis not present

## 2019-01-13 DIAGNOSIS — K5000 Crohn's disease of small intestine without complications: Secondary | ICD-10-CM | POA: Diagnosis not present

## 2019-01-13 DIAGNOSIS — I10 Essential (primary) hypertension: Secondary | ICD-10-CM | POA: Diagnosis not present

## 2019-01-13 DIAGNOSIS — C221 Intrahepatic bile duct carcinoma: Secondary | ICD-10-CM | POA: Diagnosis not present

## 2019-01-13 DIAGNOSIS — K589 Irritable bowel syndrome without diarrhea: Secondary | ICD-10-CM | POA: Diagnosis not present

## 2019-01-13 DIAGNOSIS — Z9981 Dependence on supplemental oxygen: Secondary | ICD-10-CM | POA: Diagnosis not present

## 2019-01-13 DIAGNOSIS — R32 Unspecified urinary incontinence: Secondary | ICD-10-CM | POA: Diagnosis not present

## 2019-01-14 DIAGNOSIS — R32 Unspecified urinary incontinence: Secondary | ICD-10-CM | POA: Diagnosis not present

## 2019-01-14 DIAGNOSIS — K589 Irritable bowel syndrome without diarrhea: Secondary | ICD-10-CM | POA: Diagnosis not present

## 2019-01-14 DIAGNOSIS — Z9981 Dependence on supplemental oxygen: Secondary | ICD-10-CM | POA: Diagnosis not present

## 2019-01-14 DIAGNOSIS — C221 Intrahepatic bile duct carcinoma: Secondary | ICD-10-CM | POA: Diagnosis not present

## 2019-01-14 DIAGNOSIS — K5000 Crohn's disease of small intestine without complications: Secondary | ICD-10-CM | POA: Diagnosis not present

## 2019-01-14 DIAGNOSIS — I10 Essential (primary) hypertension: Secondary | ICD-10-CM | POA: Diagnosis not present

## 2019-01-16 DIAGNOSIS — K589 Irritable bowel syndrome without diarrhea: Secondary | ICD-10-CM | POA: Diagnosis not present

## 2019-01-16 DIAGNOSIS — C221 Intrahepatic bile duct carcinoma: Secondary | ICD-10-CM | POA: Diagnosis not present

## 2019-01-16 DIAGNOSIS — K5000 Crohn's disease of small intestine without complications: Secondary | ICD-10-CM | POA: Diagnosis not present

## 2019-01-16 DIAGNOSIS — Z9981 Dependence on supplemental oxygen: Secondary | ICD-10-CM | POA: Diagnosis not present

## 2019-01-16 DIAGNOSIS — I10 Essential (primary) hypertension: Secondary | ICD-10-CM | POA: Diagnosis not present

## 2019-01-16 DIAGNOSIS — R32 Unspecified urinary incontinence: Secondary | ICD-10-CM | POA: Diagnosis not present

## 2019-02-07 DEATH — deceased

## 2019-02-08 IMAGING — DX DG ABDOMEN 1V
2 series · 2 of 2 positions shown · non-contrast
Comparison: None.

CLINICAL DATA: Follow-up biliary stent placement

EXAM:
ABDOMEN - 1 VIEW

[abdomen kub (1 of 2)]
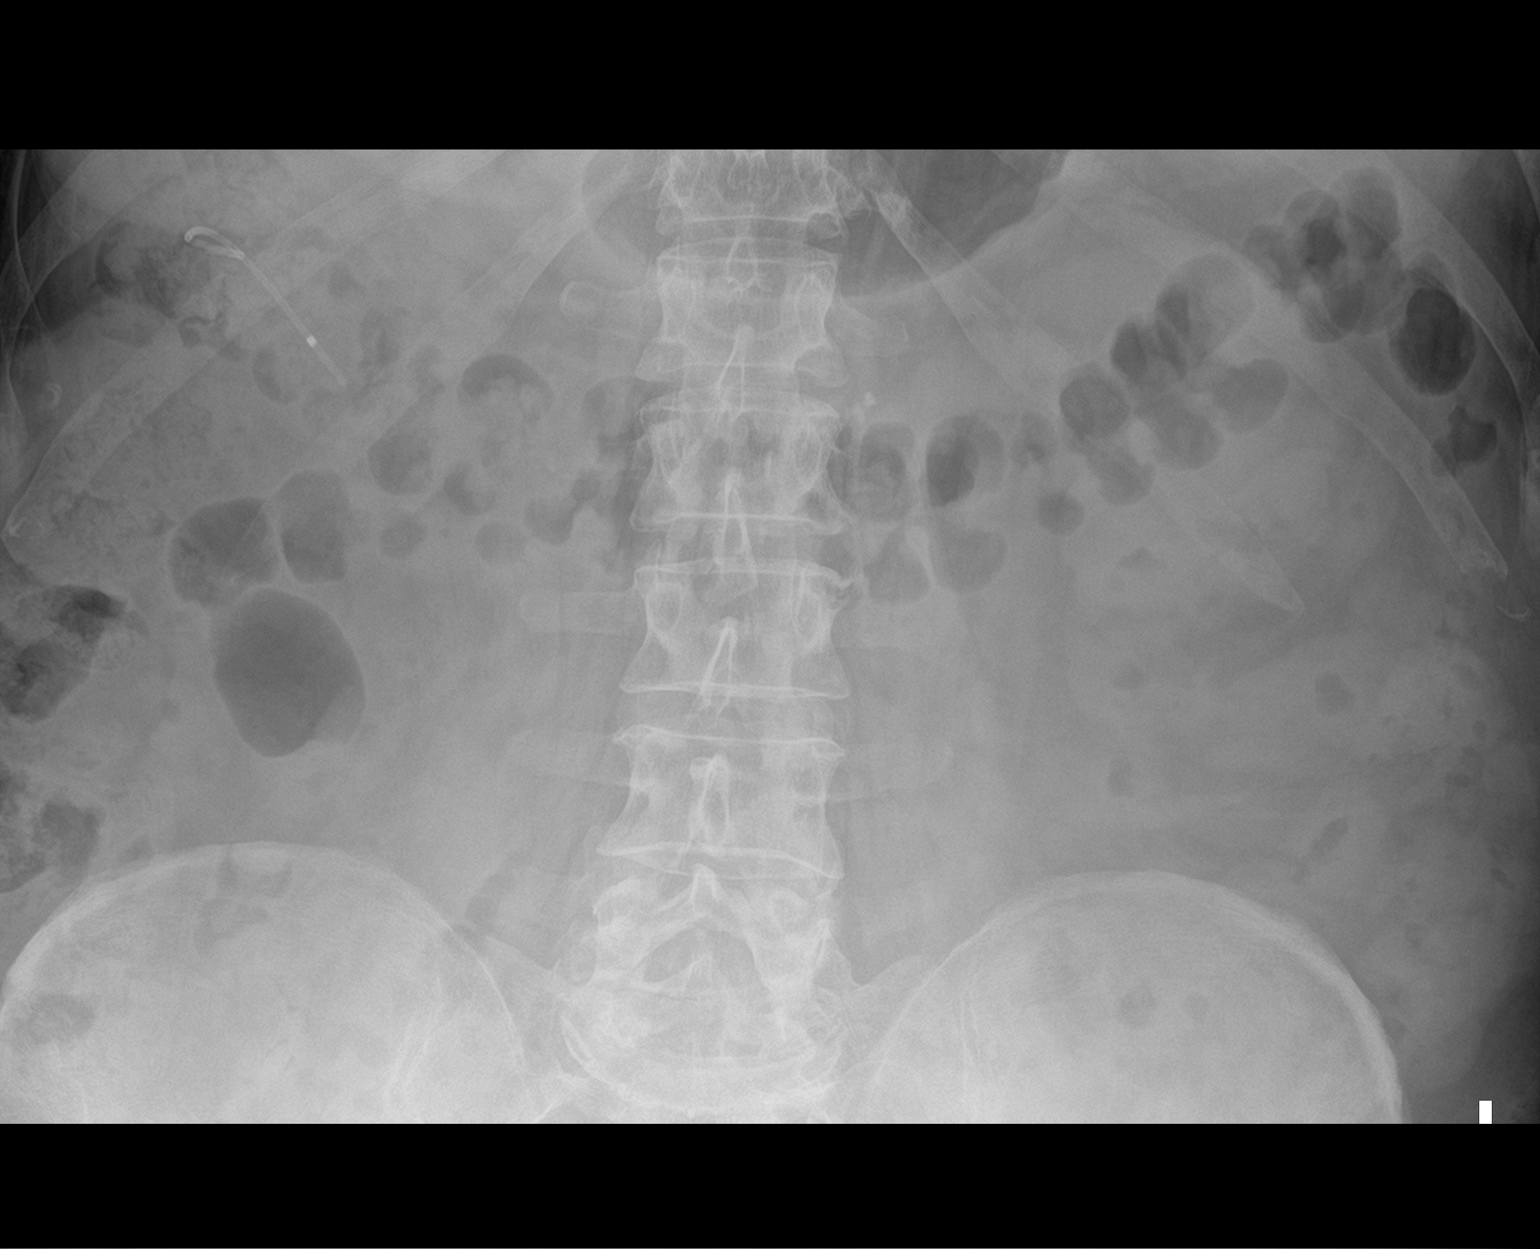

[abdomen kub (2 of 2)]
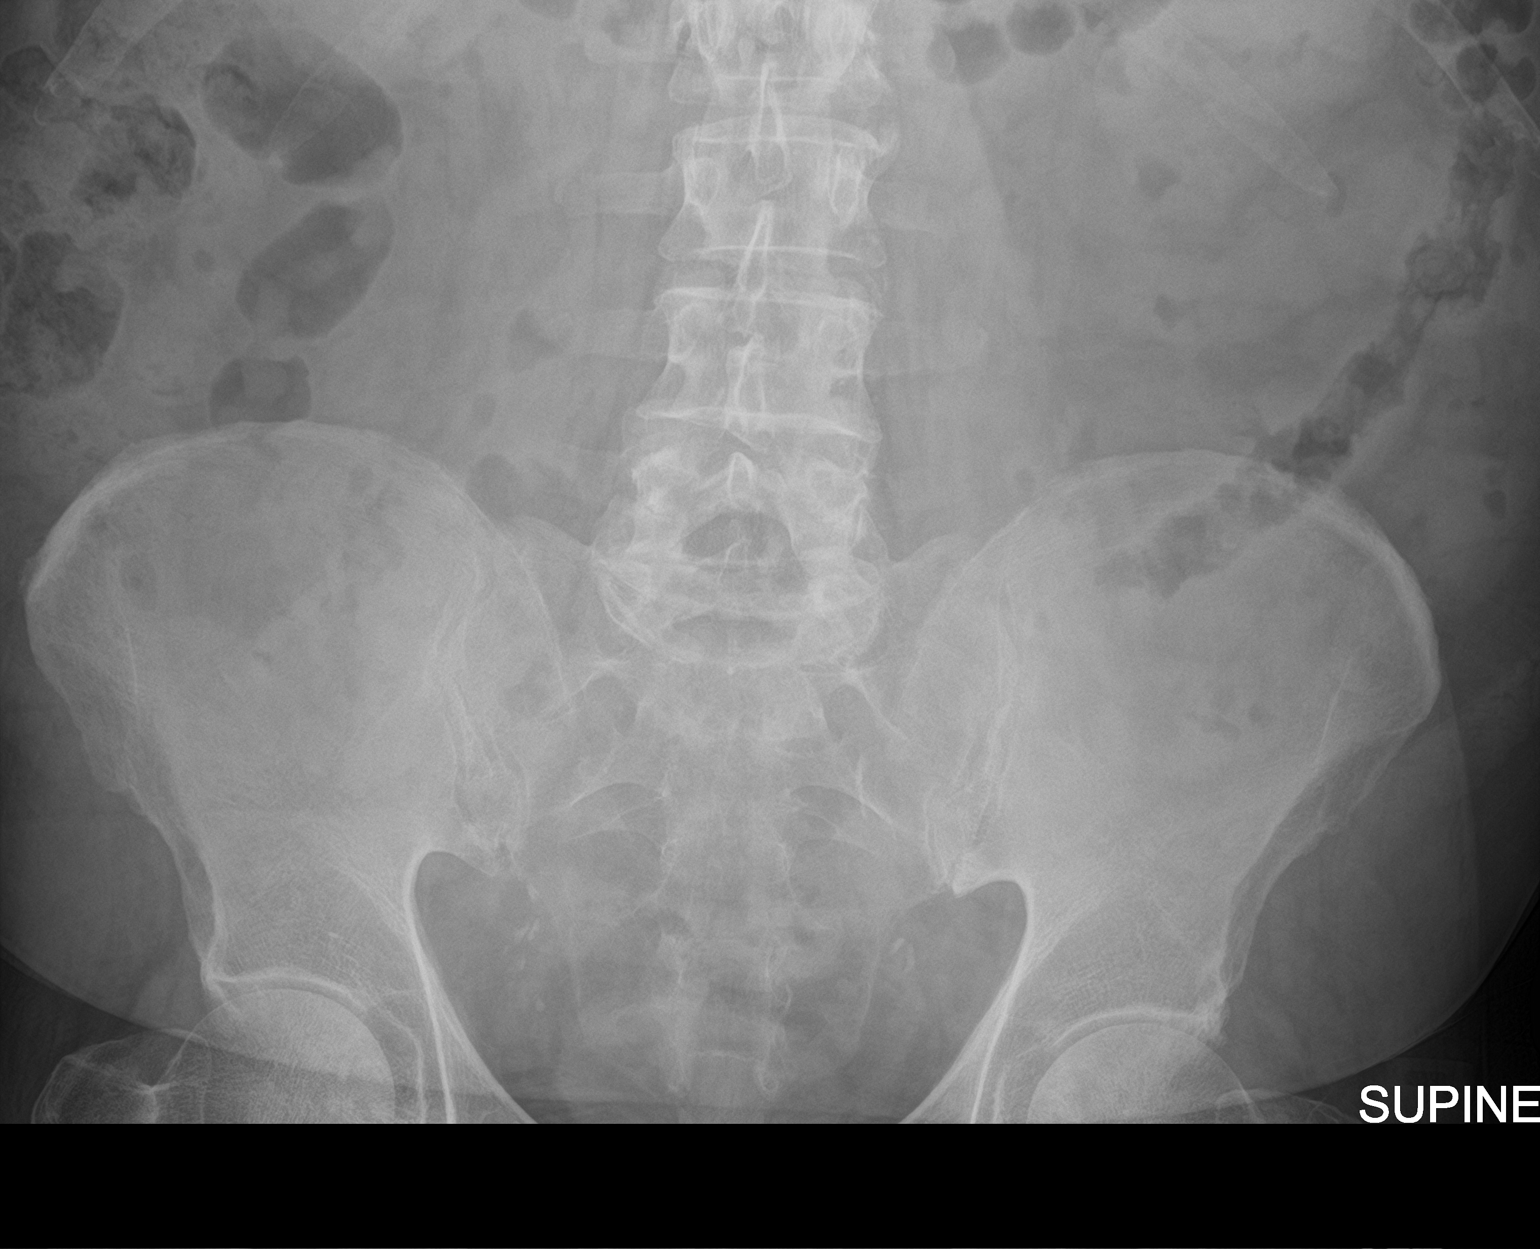

[2 of 2 positions shown; findings below may reference images not displayed]

FINDINGS: There is a calcifications/stone projected in the right upper
quadrant measuring 17 mm. There is a stent in the right upper
quadrant, projected over the hepatic flexure of the colon. There is
a tiny calcification to the left of L1-2 measuring 4 mm. No other
acute abnormalities are identified.
IMPRESSION: 1. The stent in the right upper quadrant is likely within the
proximal transverse colon near the hepatic flexure.
2. 17 mm stone in the right upper quadrant could be in the
gallbladder or kidney.
3. Tiny calcification to the left of L1-2 is age indeterminate. A
small stone is not excluded. Recommend clinical correlation.

## 2019-02-22 IMAGING — DX DG ABDOMEN 1V
2 series · 2 of 2 positions shown · non-contrast
Comparison: December 31, 2017

CLINICAL DATA: Crohn's disease

EXAM:
ABDOMEN - 1 VIEW

[abdomen kub (1 of 2)]
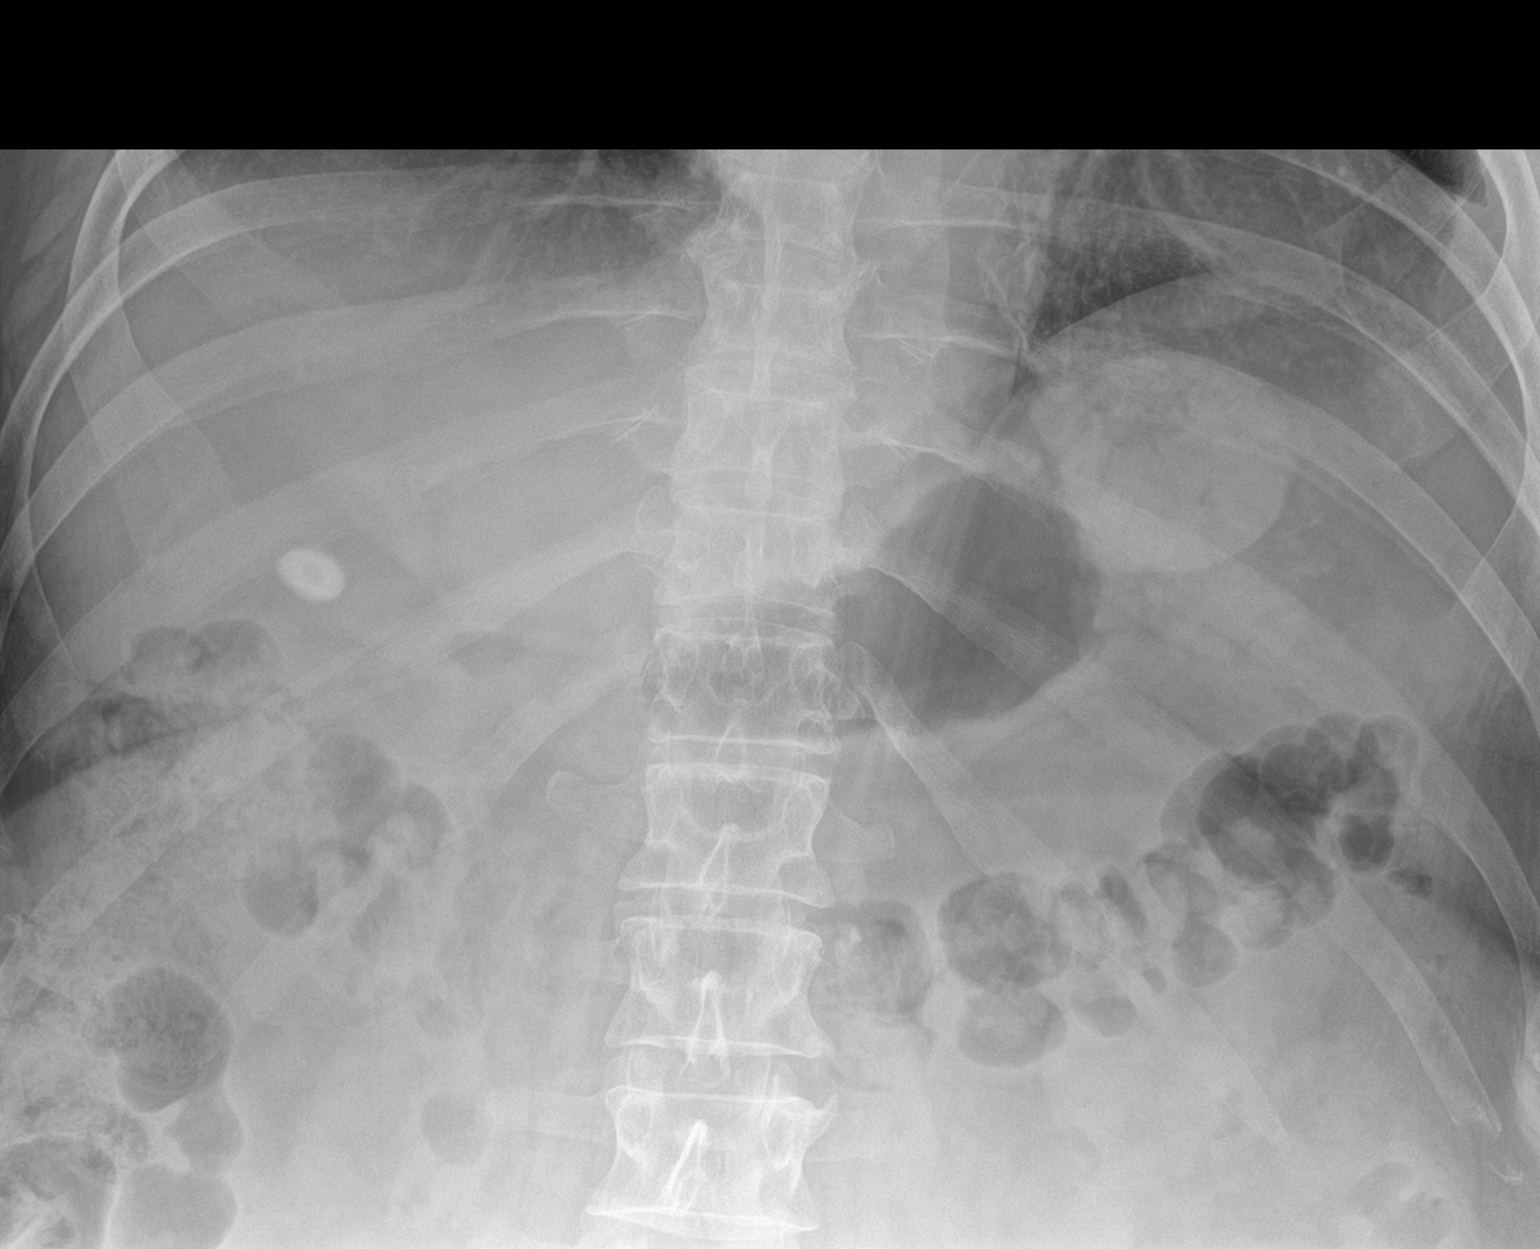

[abdomen kub (2 of 2)]
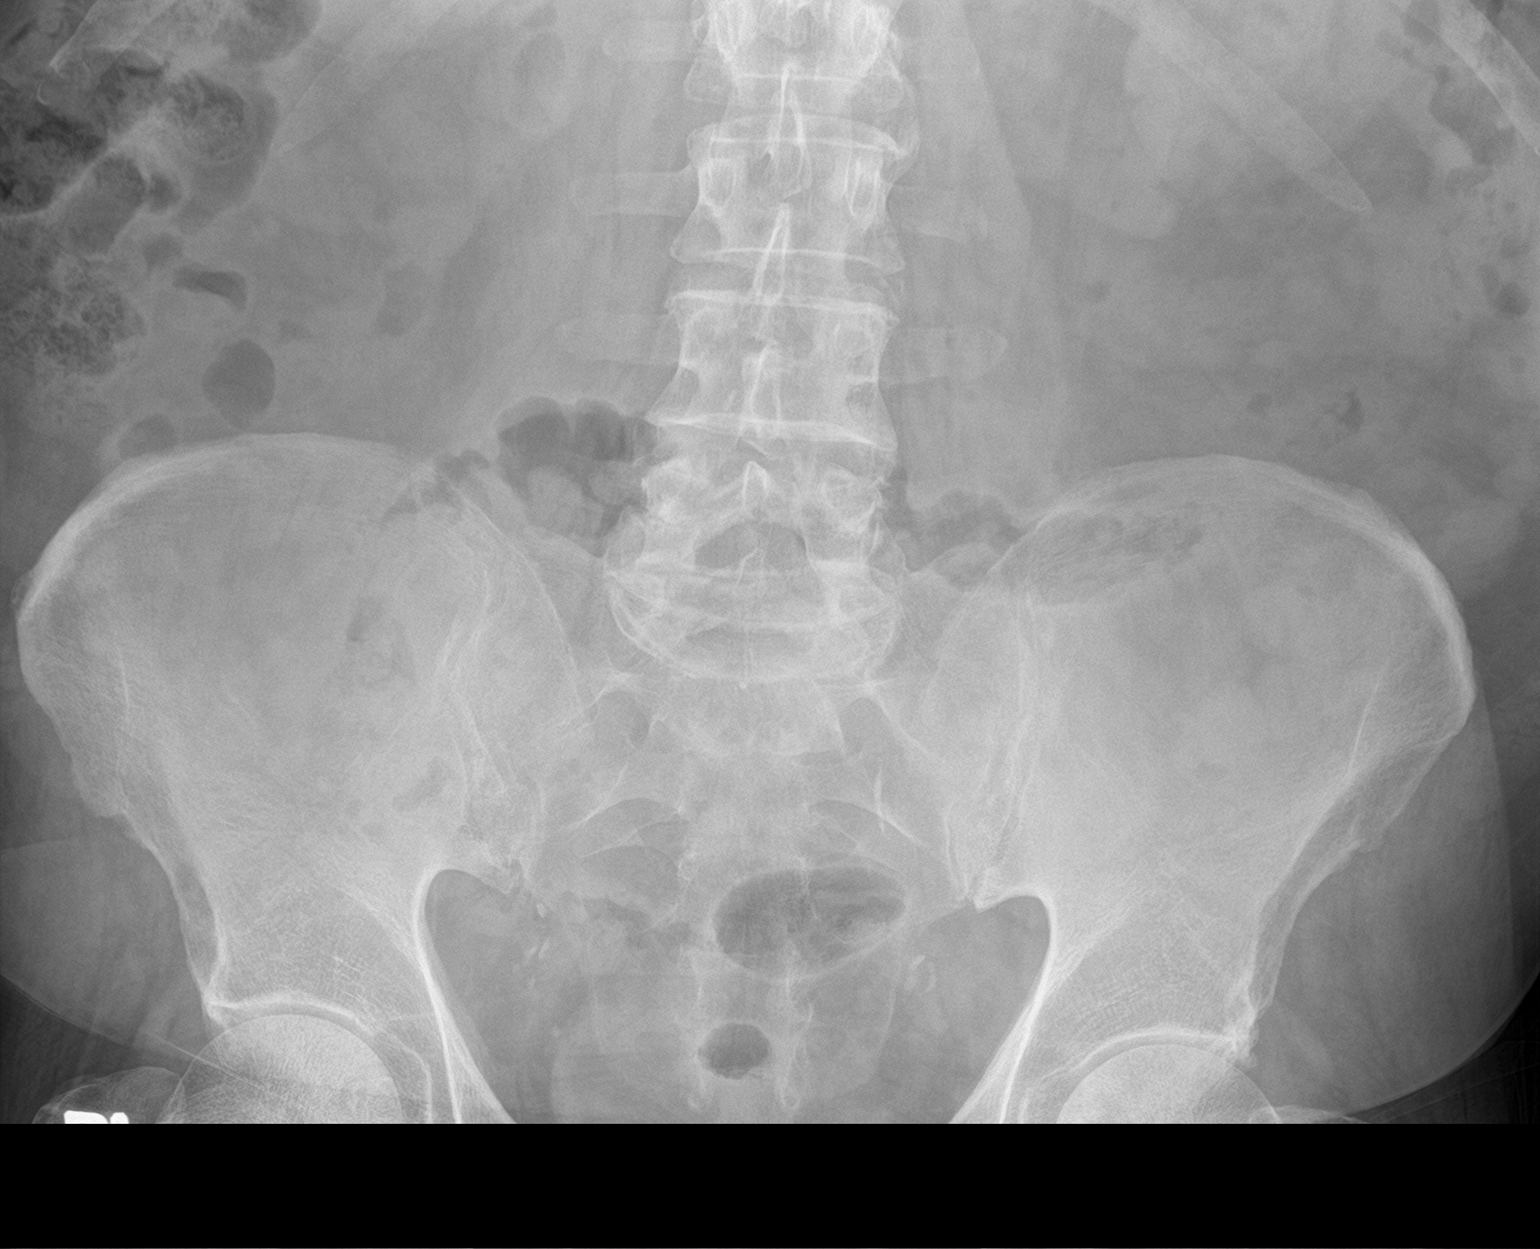

[2 of 2 positions shown; findings below may reference images not displayed]

FINDINGS: The previously noted stent in the right upper abdomen is no longer
evident. No stent is seen elsewhere in the abdomen or pelvis. There
is a laminated calcification in the right upper quadrant, stable.
There are calcifications at L1-2 on the left, stable.

There is fairly diffuse stool throughout much of the colon. There is
no bowel dilatation or air-fluid level to suggest bowel obstruction.
No free air. Foci of common femoral artery calcification noted. Lung
bases clear.
IMPRESSION: 1. No stent apparent currently in the right upper quadrant. Stent
noted previously in this area is no longer evident.

2.  Probable gallstone right upper quadrant, stable.

3. Calcifications just to the left of L1-2. Question a degree of
chronic pancreatitis.

4. No evident bowel obstruction or free air. Fairly diffuse stool in
colon. Lung bases clear.

These results will be called to the ordering clinician or
representative by the Radiologist Assistant, and communication
documented in the PACS or zVision Dashboard.

## 2019-07-23 IMAGING — DX ABDOMEN - 1 VIEW
2 series · 2 of 2 positions shown · non-contrast
Comparison: 01/14/2018

CLINICAL DATA: Pancreatic stent placement

EXAM:
ABDOMEN - 1 VIEW

[abdomen kub (1 of 2)]
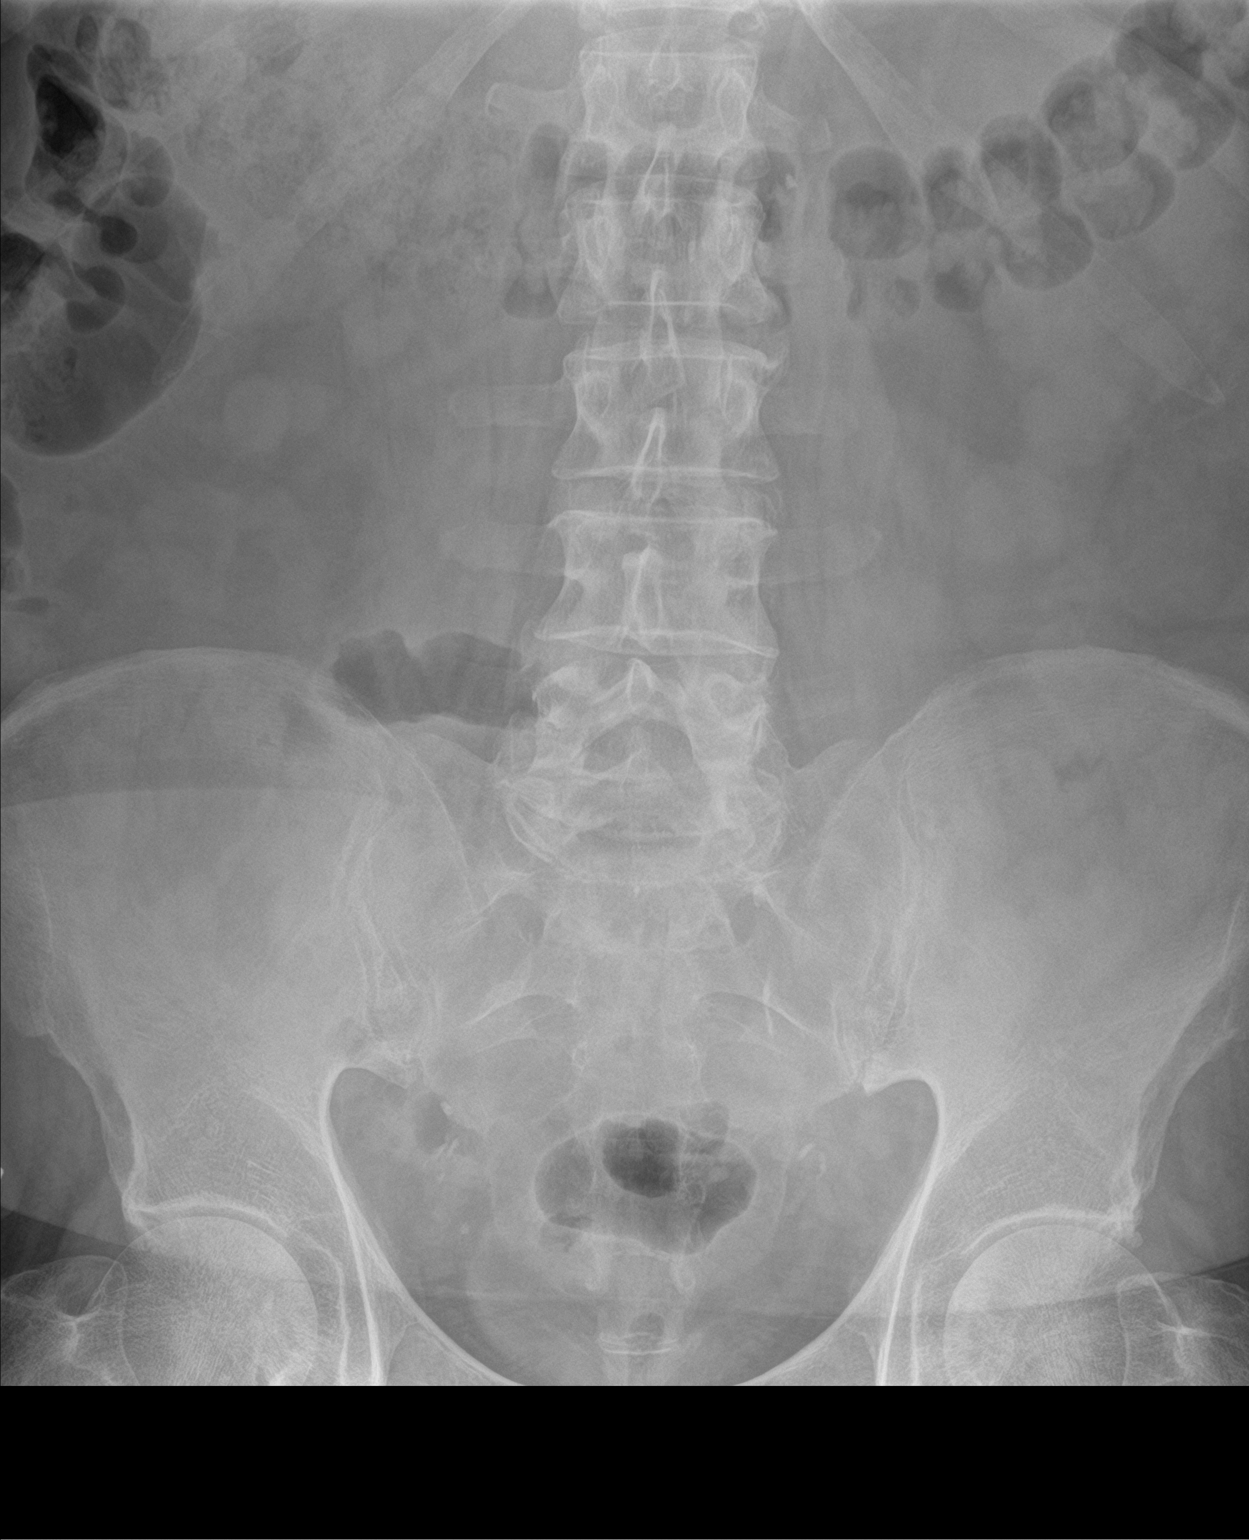

[abdomen kub (2 of 2)]
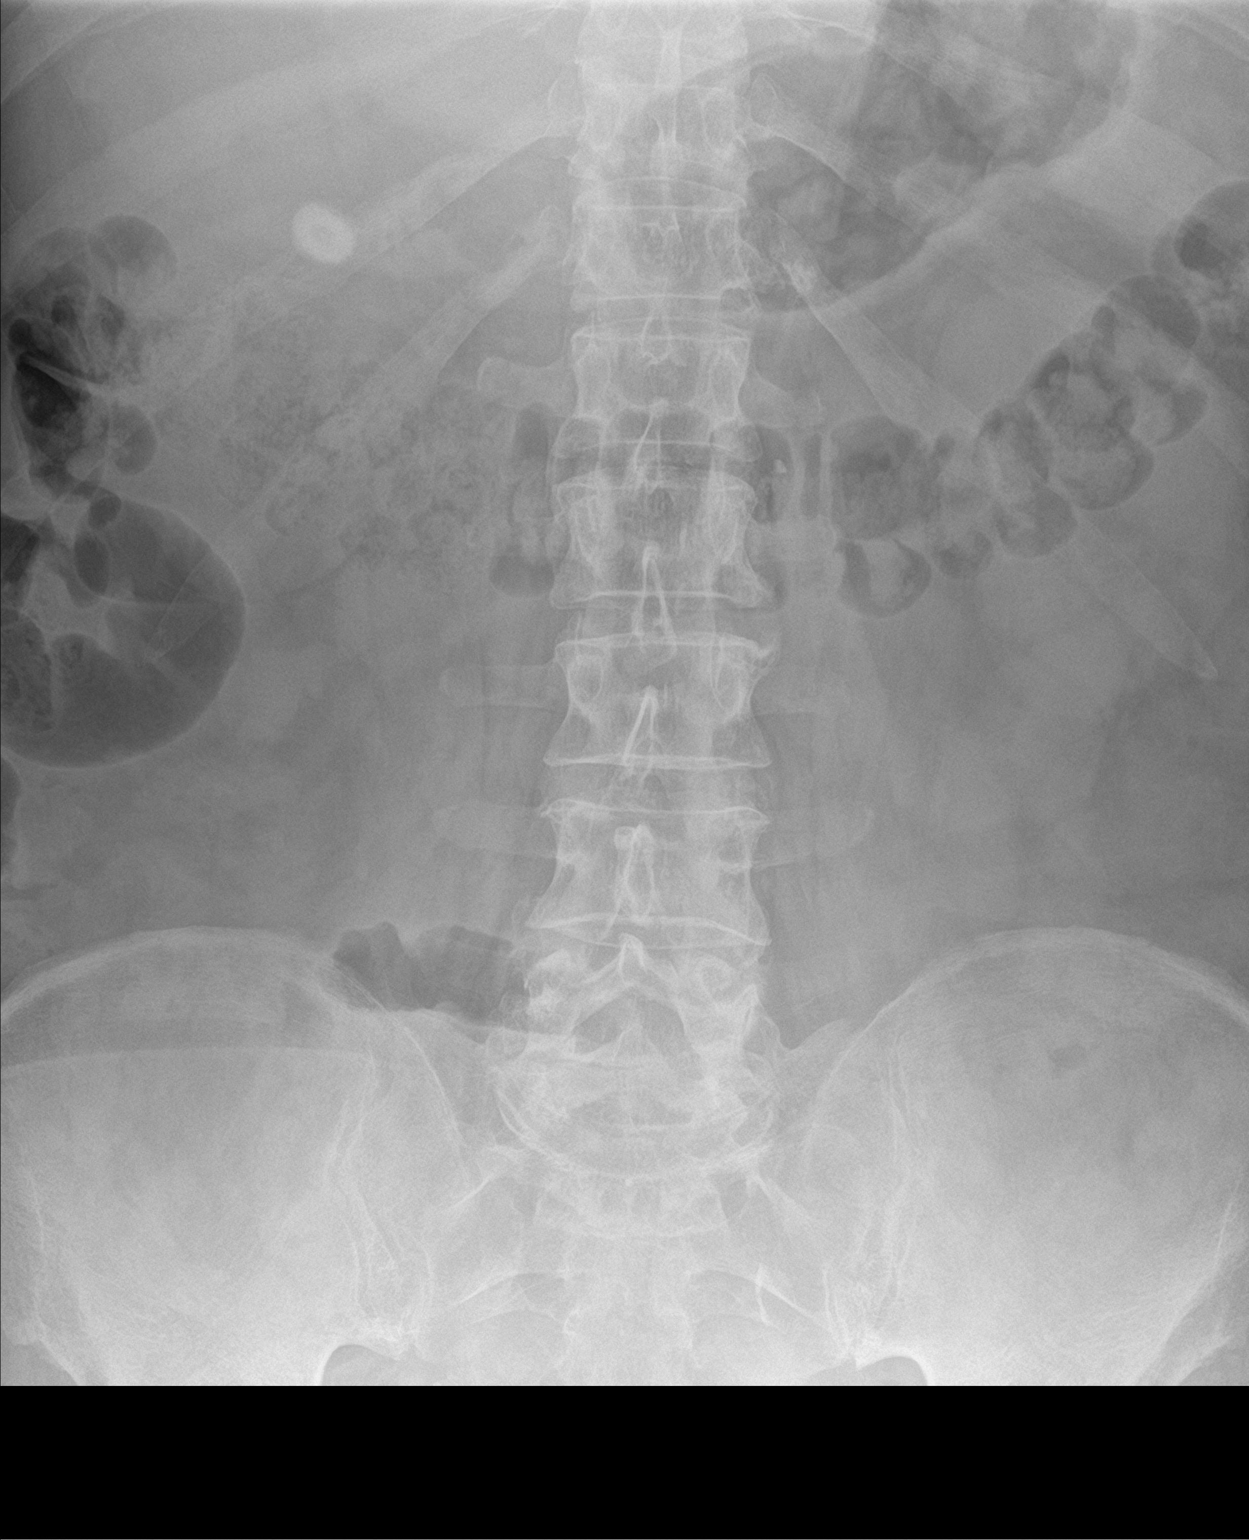

[2 of 2 positions shown; findings below may reference images not displayed]

FINDINGS: 18 mm gallstone noted in the right upper quadrant. No visible ductal
stent. Nonobstructive bowel gas pattern. No free air or
organomegaly.
IMPRESSION: No visible biliary or pancreatic ductal stent by plain film.

Cholelithiasis.
